# Patient Record
Sex: Female | Born: 1990
Health system: Southern US, Community
[De-identification: ages and names within clinical notes are randomized; demographics above are authoritative.]

## PROBLEM LIST (undated history)

## (undated) DIAGNOSIS — Z23 Encounter for immunization: Secondary | ICD-10-CM

## (undated) DIAGNOSIS — D229 Melanocytic nevi, unspecified: Secondary | ICD-10-CM

## (undated) DIAGNOSIS — R5383 Other fatigue: Secondary | ICD-10-CM

## (undated) DIAGNOSIS — F419 Anxiety disorder, unspecified: Secondary | ICD-10-CM

## (undated) HISTORY — PX: OTHER SURGICAL HISTORY: SHX169

## (undated) HISTORY — DX: Melanocytic nevi, unspecified: D22.9

## (undated) HISTORY — PX: MOLE REMOVAL: SHX2046

## (undated) HISTORY — DX: Anxiety disorder, unspecified: F41.9

## (undated) HISTORY — DX: Encounter for immunization: Z23

## (undated) HISTORY — DX: Other fatigue: R53.83

---
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
## 2016-09-17 DIAGNOSIS — S61412A Laceration without foreign body of left hand, initial encounter: Secondary | ICD-10-CM | POA: Diagnosis not present

## 2016-09-28 DIAGNOSIS — S61412S Laceration without foreign body of left hand, sequela: Secondary | ICD-10-CM | POA: Diagnosis not present

## 2016-12-30 DIAGNOSIS — N939 Abnormal uterine and vaginal bleeding, unspecified: Secondary | ICD-10-CM | POA: Diagnosis not present

## 2017-01-16 DIAGNOSIS — Z124 Encounter for screening for malignant neoplasm of cervix: Secondary | ICD-10-CM | POA: Diagnosis not present

## 2017-01-16 DIAGNOSIS — E669 Obesity, unspecified: Secondary | ICD-10-CM | POA: Diagnosis not present

## 2017-01-16 DIAGNOSIS — Z01411 Encounter for gynecological examination (general) (routine) with abnormal findings: Secondary | ICD-10-CM | POA: Diagnosis not present

## 2017-01-16 DIAGNOSIS — Z3202 Encounter for pregnancy test, result negative: Secondary | ICD-10-CM | POA: Diagnosis not present

## 2017-01-16 DIAGNOSIS — Z1151 Encounter for screening for human papillomavirus (HPV): Secondary | ICD-10-CM | POA: Diagnosis not present

## 2017-04-05 DIAGNOSIS — W57XXXA Bitten or stung by nonvenomous insect and other nonvenomous arthropods, initial encounter: Secondary | ICD-10-CM | POA: Diagnosis not present

## 2017-04-05 DIAGNOSIS — S70261A Insect bite (nonvenomous), right hip, initial encounter: Secondary | ICD-10-CM | POA: Diagnosis not present

## 2017-05-06 DIAGNOSIS — Z309 Encounter for contraceptive management, unspecified: Secondary | ICD-10-CM | POA: Diagnosis not present

## 2017-05-06 DIAGNOSIS — Z3202 Encounter for pregnancy test, result negative: Secondary | ICD-10-CM | POA: Diagnosis not present

## 2017-05-06 DIAGNOSIS — Z113 Encounter for screening for infections with a predominantly sexual mode of transmission: Secondary | ICD-10-CM | POA: Diagnosis not present

## 2017-08-11 DIAGNOSIS — Z23 Encounter for immunization: Secondary | ICD-10-CM | POA: Diagnosis not present

## 2017-08-21 DIAGNOSIS — J069 Acute upper respiratory infection, unspecified: Secondary | ICD-10-CM | POA: Diagnosis not present

## 2017-08-21 DIAGNOSIS — R05 Cough: Secondary | ICD-10-CM | POA: Diagnosis not present

## 2017-08-21 DIAGNOSIS — R300 Dysuria: Secondary | ICD-10-CM | POA: Diagnosis not present

## 2017-11-11 DIAGNOSIS — J069 Acute upper respiratory infection, unspecified: Secondary | ICD-10-CM | POA: Diagnosis not present

## 2017-11-11 DIAGNOSIS — R6889 Other general symptoms and signs: Secondary | ICD-10-CM | POA: Diagnosis not present

## 2017-12-16 ENCOUNTER — Ambulatory Visit: Payer: BLUE CROSS/BLUE SHIELD | Admitting: Obstetrics and Gynecology

## 2017-12-16 ENCOUNTER — Encounter: Payer: Self-pay | Admitting: Obstetrics and Gynecology

## 2017-12-16 VITALS — BP 122/84 | HR 99 | Ht 67.0 in | Wt 265.0 lb

## 2017-12-16 DIAGNOSIS — N898 Other specified noninflammatory disorders of vagina: Secondary | ICD-10-CM | POA: Diagnosis not present

## 2017-12-16 DIAGNOSIS — N6459 Other signs and symptoms in breast: Secondary | ICD-10-CM

## 2017-12-16 DIAGNOSIS — L309 Dermatitis, unspecified: Secondary | ICD-10-CM

## 2017-12-16 DIAGNOSIS — Z113 Encounter for screening for infections with a predominantly sexual mode of transmission: Secondary | ICD-10-CM

## 2017-12-16 DIAGNOSIS — N649 Disorder of breast, unspecified: Secondary | ICD-10-CM

## 2017-12-16 LAB — POCT WET PREP WITH KOH
Clue Cells Wet Prep HPF POC: NEGATIVE
KOH PREP POC: NEGATIVE
Trichomonas, UA: NEGATIVE
Yeast Wet Prep HPF POC: NEGATIVE

## 2017-12-16 NOTE — Progress Notes (Signed)
Chief Complaint  Patient presents with  . Breast Problem    Rash under arms, Lft breast had red spot and tender   . Vaginitis    HPI:      Melanie Frederick is a 27 y.o. G0P0000 who LMP was Patient's last menstrual period was 12/02/2017 (exact date)., presents today for increased vag d/c without odor, irritation. Sx for a few days, and pt just finished doxy. She has not been sex active for a few months. Neg STD check 8/18.  She has also had itching bilat axilla for the past month. Has not changed deodorant/detergent. Uses dryer sheets. Has tried OTC hydrocortisone crm which makes it burn. Has also had a red, bruised looking area above nipple LT breast. No masses, itch, nipple d/c. Sx for a couple days. No meds to treat.  Pt current on annual in [place]. Moved back here recently.   Past Medical History:  Diagnosis Date  . Nevus     Past Surgical History:  Procedure Laterality Date  . ARM SKIN LESION BIOPSY / EXCISION Left 2012  . LYMPH NODE DISSECTION Right 2011    History reviewed. No pertinent family history.  Social History   Socioeconomic History  . Marital status: Single    Spouse name: Not on file  . Number of children: Not on file  . Years of education: Not on file  . Highest education level: Not on file  Social Needs  . Financial resource strain: Not on file  . Food insecurity - worry: Not on file  . Food insecurity - inability: Not on file  . Transportation needs - medical: Not on file  . Transportation needs - non-medical: Not on file  Occupational History  . Not on file  Tobacco Use  . Smoking status: Never Smoker  . Smokeless tobacco: Never Used  Substance and Sexual Activity  . Alcohol use: Yes    Comment: occasinally   . Drug use: No  . Sexual activity: Not Currently    Birth control/protection: Pill  Other Topics Concern  . Not on file  Social History Narrative  . Not on file     Current Outpatient Medications:  .  JUNEL FE 1.5/30 1.5-30  MG-MCG tablet, Take 1 tablet by mouth daily., Disp: , Rfl: 8   ROS:  Review of Systems  Constitutional: Negative for fever.  Gastrointestinal: Negative for blood in stool, constipation, diarrhea, nausea and vomiting.  Genitourinary: Positive for vaginal discharge. Negative for dyspareunia, dysuria, flank pain, frequency, hematuria, urgency, vaginal bleeding and vaginal pain.  Musculoskeletal: Negative for back pain.  Skin: Positive for color change and rash.     OBJECTIVE:   Vitals:  BP 122/84   Pulse 99   Ht 5\' 7"  (1.702 m)   Wt 265 lb (120.2 kg)   LMP 12/02/2017 (Exact Date)   BMI 41.50 kg/m   Physical Exam  Constitutional: She is oriented to person, place, and time and well-developed, well-nourished, and in no distress. Vital signs are normal.  Pulmonary/Chest:    1:00 WITH SLIGHTLY DARKER DISCOLORATION, BUT NO LESIONS/MASSES/DERMATITIS  Genitourinary: Uterus normal, cervix normal, right adnexa normal, left adnexa normal and vulva normal. Uterus is not enlarged. Cervix exhibits no motion tenderness and no tenderness. Right adnexum displays no mass and no tenderness. Left adnexum displays no mass and no tenderness. Vulva exhibits no erythema, no exudate, no lesion, no rash and no tenderness. Vagina exhibits no lesion. Thin  odorless  yellow and vaginal discharge found.  Neurological: She is oriented to person, place, and time.  Skin: Skin is warm and dry. Rash noted. Rash is papular.  MILD ERYTHEMATOUS PAPULES RT AXILLA, NO LESIONS LT AXILLA; NO ULCERS/LAN  Vitals reviewed.   Results: Results for orders placed or performed in visit on 12/16/17 (from the past 24 hour(s))  POCT Wet Prep with KOH     Status: Normal   Collection Time: 12/16/17  5:00 PM  Result Value Ref Range   Trichomonas, UA Negative    Clue Cells Wet Prep HPF POC neg    Epithelial Wet Prep HPF POC  Few, Moderate, Many, Too numerous to count   Yeast Wet Prep HPF POC neg    Bacteria Wet Prep HPF POC  Few    RBC Wet Prep HPF POC     WBC Wet Prep HPF POC     KOH Prep POC Negative Negative     Assessment/Plan: Vaginal discharge - Neg wet prep/exam. F/u prn. sx. May need yeast vag tx since recently on abx. - Plan: POCT Wet Prep with KOH  Screening for STD (sexually transmitted disease) - Plan: Chlamydia/Gonococcus/Trichomonas, NAA  Dermatitis - Bilat axilla due to sx hx, question fungal. Try athlete's foot powder/d/c shaving for now/no dryer sheets on clothes. F/u prn.   Breast symptom - Slight discoloration. Neg exam. Reassurance. F/u prn.     Return if symptoms worsen or fail to improve.  Leyani Gargus B. Marieann Zipp, PA-C 12/16/2017 5:03 PM

## 2017-12-16 NOTE — Patient Instructions (Signed)
I value your feedback and entrusting us with your care. If you get a [facility] patient survey, I would appreciate you taking the time to let us know about your experience today. Thank you! 

## 2017-12-17 DIAGNOSIS — Z113 Encounter for screening for infections with a predominantly sexual mode of transmission: Secondary | ICD-10-CM | POA: Diagnosis not present

## 2017-12-20 LAB — CHLAMYDIA/GONOCOCCUS/TRICHOMONAS, NAA
Chlamydia by NAA: NEGATIVE
Gonococcus by NAA: NEGATIVE
Trich vag by NAA: NEGATIVE

## 2018-02-20 ENCOUNTER — Encounter: Payer: Self-pay | Admitting: Obstetrics and Gynecology

## 2018-02-20 ENCOUNTER — Ambulatory Visit (INDEPENDENT_AMBULATORY_CARE_PROVIDER_SITE_OTHER): Payer: BLUE CROSS/BLUE SHIELD | Admitting: Obstetrics and Gynecology

## 2018-02-20 VITALS — BP 122/70 | HR 99 | Ht 67.0 in | Wt 243.0 lb

## 2018-02-20 DIAGNOSIS — Z124 Encounter for screening for malignant neoplasm of cervix: Secondary | ICD-10-CM | POA: Diagnosis not present

## 2018-02-20 DIAGNOSIS — Z01419 Encounter for gynecological examination (general) (routine) without abnormal findings: Secondary | ICD-10-CM

## 2018-02-20 DIAGNOSIS — Z3041 Encounter for surveillance of contraceptive pills: Secondary | ICD-10-CM | POA: Diagnosis not present

## 2018-02-20 MED ORDER — JUNEL FE 1.5/30 1.5-30 MG-MCG PO TABS: 1.0000 | ORAL_TABLET | Freq: Every day | ORAL | 3 refills | Status: DC

## 2018-02-20 NOTE — Progress Notes (Signed)
PCP:  Patient, No Pcp Per   Chief Complaint  Patient presents with  . Gynecologic Exam     HPI:      Melanie Frederick is a 27 y.o. G0P0000 who LMP was Patient's last menstrual period was 01/30/2018 (approximate)., presents today for her annual examination.  Her menses are regular every 28-30 days, lasting 3 days.  Dysmenorrhea none. She does not have intermenstrual bleeding.  Sex activity: not sexually active. On OCPs. Last Pap: Feb 15, 2014  Results were: no abnormalities  Hx of STDs: none; last done 3/19 Vag d/c from 3/19 improved but still present; no itch/odor. Neg wet prep/STD testing at that time. Not sex active since.   There is no FH of breast cancer. There is no FH of ovarian cancer. The patient does not do self-breast exams.  Tobacco use: The patient denies current or previous tobacco use. Alcohol use: none No drug use.  Exercise: moderately active  She does not get adequate calcium and Vitamin D in her diet.   Past Medical History:  Diagnosis Date  . Nevus   . Vaccine for human papilloma virus (HPV) types 6, 11, 16, and 18 administered     Past Surgical History:  Procedure Laterality Date  . ARM SKIN LESION BIOPSY / EXCISION Left 2012  . LYMPH NODE DISSECTION Right 2011    History reviewed. No pertinent family history.  Social History   Socioeconomic History  . Marital status: Single    Spouse name: Not on file  . Number of children: Not on file  . Years of education: Not on file  . Highest education level: Not on file  Occupational History  . Not on file  Social Needs  . Financial resource strain: Not on file  . Food insecurity:    Worry: Not on file    Inability: Not on file  . Transportation needs:    Medical: Not on file    Non-medical: Not on file  Tobacco Use  . Smoking status: Never Smoker  . Smokeless tobacco: Never Used  Substance and Sexual Activity  . Alcohol use: Yes    Comment: occasinally   . Drug use: No  . Sexual  activity: Not Currently    Birth control/protection: Pill  Lifestyle  . Physical activity:    Days per week: Not on file    Minutes per session: Not on file  . Stress: Not on file  Relationships  . Social connections:    Talks on phone: Not on file    Gets together: Not on file    Attends religious service: Not on file    Active member of club or organization: Not on file    Attends meetings of clubs or organizations: Not on file    Relationship status: Not on file  . Intimate partner violence:    Fear of current or ex partner: Not on file    Emotionally abused: Not on file    Physically abused: Not on file    Forced sexual activity: Not on file  Other Topics Concern  . Not on file  Social History Narrative  . Not on file    Outpatient Medications Prior to Visit  Medication Sig Dispense Refill  . phentermine 15 MG capsule Take 15 mg by mouth every morning.    Lenda Kelp FE 1.5/30 1.5-30 MG-MCG tablet Take 1 tablet by mouth daily.  8   No facility-administered medications prior to visit.  ROS:  Review of Systems  Constitutional: Negative for fatigue, fever and unexpected weight change.  Respiratory: Negative for cough, shortness of breath and wheezing.   Cardiovascular: Negative for chest pain, palpitations and leg swelling.  Gastrointestinal: Negative for blood in stool, constipation, diarrhea, nausea and vomiting.  Endocrine: Negative for cold intolerance, heat intolerance and polyuria.  Genitourinary: Negative for dyspareunia, dysuria, flank pain, frequency, genital sores, hematuria, menstrual problem, pelvic pain, urgency, vaginal bleeding, vaginal discharge and vaginal pain.  Musculoskeletal: Negative for back pain, joint swelling and myalgias.  Skin: Negative for rash.  Neurological: Negative for dizziness, syncope, light-headedness, numbness and headaches.  Hematological: Negative for adenopathy.  Psychiatric/Behavioral: Negative for agitation, confusion, sleep  disturbance and suicidal ideas. The patient is not nervous/anxious.   BREAST: No symptoms   Objective: BP 122/70   Pulse 99   Ht 5\' 7"  (1.702 m)   Wt 243 lb (110.2 kg)   LMP 01/30/2018 (Approximate)   BMI 38.06 kg/m    Physical Exam  Constitutional: She is oriented to person, place, and time. She appears well-developed and well-nourished.  Genitourinary: Vagina normal and uterus normal. There is no rash or tenderness on the right labia. There is no rash or tenderness on the left labia. No erythema or tenderness in the vagina. No vaginal discharge found. Right adnexum does not display mass and does not display tenderness. Left adnexum does not display mass and does not display tenderness. Cervix does not exhibit motion tenderness or polyp. Uterus is not enlarged or tender.  Neck: Normal range of motion. No thyromegaly present.  Cardiovascular: Normal rate, regular rhythm and normal heart sounds.  No murmur heard. Pulmonary/Chest: Effort normal and breath sounds normal. Right breast exhibits no mass, no nipple discharge, no skin change and no tenderness. Left breast exhibits no mass, no nipple discharge, no skin change and no tenderness.  Abdominal: Soft. There is no tenderness. There is no guarding.  Musculoskeletal: Normal range of motion.  Neurological: She is alert and oriented to person, place, and time. No cranial nerve deficit.  Psychiatric: She has a normal mood and affect. Her behavior is normal.  Vitals reviewed.   Assessment/Plan: Encounter for annual routine gynecological examination  Cervical cancer screening - Plan: IGP, rfx Aptima HPV ASCU, CANCELED: IGP,CtNgTv,rfx Aptima HPV ASCU  Encounter for surveillance of contraceptive pills - OCP RF - Plan: JUNEL FE 1.5/30 1.5-30 MG-MCG tablet  Meds ordered this encounter  Medications  . JUNEL FE 1.5/30 1.5-30 MG-MCG tablet    Sig: Take 1 tablet by mouth daily.    Dispense:  3 Package    Refill:  3    Order Specific  Question:   Supervising Provider    Answer:   Gae Dry [[id number]]             GYN counsel adequate intake of calcium and vitamin D, diet and exercise     F/U  Return in about 1 year (around 02/21/2019).  Alicia B. Copland, PA-C 02/20/2018 10:48 AM

## 2018-02-20 NOTE — Patient Instructions (Signed)
I value your feedback and entrusting us with your care. If you get a [facility] patient survey, I would appreciate you taking the time to let us know about your experience today. Thank you! 

## 2018-02-24 LAB — IGP, RFX APTIMA HPV ASCU: PAP SMEAR COMMENT: 0

## 2018-05-13 ENCOUNTER — Ambulatory Visit: Payer: Self-pay | Admitting: Dietician

## 2018-05-15 ENCOUNTER — Encounter: Payer: Self-pay | Admitting: Dietician

## 2018-09-09 ENCOUNTER — Ambulatory Visit (INDEPENDENT_AMBULATORY_CARE_PROVIDER_SITE_OTHER): Payer: BLUE CROSS/BLUE SHIELD | Admitting: Obstetrics and Gynecology

## 2018-09-09 ENCOUNTER — Encounter: Payer: Self-pay | Admitting: Obstetrics and Gynecology

## 2018-09-09 ENCOUNTER — Other Ambulatory Visit (HOSPITAL_COMMUNITY)
Admission: RE | Admit: 2018-09-09 | Discharge: 2018-09-09 | Disposition: A | Discharge status: Home / Self Care | Payer: BLUE CROSS/BLUE SHIELD | Source: Ambulatory Visit | Attending: Obstetrics and Gynecology | Admitting: Obstetrics and Gynecology

## 2018-09-09 VITALS — BP 100/70 | HR 74 | Ht 67.0 in | Wt 234.0 lb

## 2018-09-09 DIAGNOSIS — Z113 Encounter for screening for infections with a predominantly sexual mode of transmission: Secondary | ICD-10-CM | POA: Diagnosis not present

## 2018-09-09 DIAGNOSIS — Z3202 Encounter for pregnancy test, result negative: Secondary | ICD-10-CM | POA: Diagnosis not present

## 2018-09-09 DIAGNOSIS — N898 Other specified noninflammatory disorders of vagina: Secondary | ICD-10-CM | POA: Diagnosis not present

## 2018-09-09 DIAGNOSIS — N921 Excessive and frequent menstruation with irregular cycle: Secondary | ICD-10-CM

## 2018-09-09 LAB — POCT URINE PREGNANCY: Preg Test, Ur: NEGATIVE

## 2018-09-09 NOTE — Patient Instructions (Signed)
I value your feedback and entrusting us with your care. If you get a [facility] patient survey, I would appreciate you taking the time to let us know about your experience today. Thank you! 

## 2018-09-09 NOTE — Progress Notes (Signed)
Patient, No Pcp Per   Chief Complaint  Patient presents with  . Vaginal Bleeding    for the past month pt has been spotting and cramping, yesterday she had some watery fluid rolling down her leg (only time she has seen this)    HPI:      Ms. Latiya DENIJAH KARRER is a 27 y.o. G0P0000 who LMP was Patient's last menstrual period was 08/18/2018 (approximate)., presents today for spotting/cramping last pill pack without late/missed pills. PMP was normal but LMP (last wk) was just spotting. Pt started new pill pack this wk and bleeding has stopped.  Menses usually are monthly, lasting 4-5 days, no BTB.  Pt is sex active with new partner, on OCPs, sometimes using condoms. Pt has noted increased watery d/c without odor, irritation recently.   Past Medical History:  Diagnosis Date  . Fatigue   . Nevus   . Vaccine for human papilloma virus (HPV) types 6, 11, 16, and 18 administered     Past Surgical History:  Procedure Laterality Date  . ARM SKIN LESION BIOPSY / EXCISION Left 2012  . LYMPH NODE DISSECTION Right 2011    Family History  Problem Relation Age of Onset  . Hypothyroidism Paternal Grandmother   . Brain cancer Other        malignant, great mat uncle-60  . Cancer Other 60       colon cancer    Social History   Socioeconomic History  . Marital status: Single    Spouse name: Not on file  . Number of children: Not on file  . Years of education: Not on file  . Highest education level: Not on file  Occupational History  . Not on file  Social Needs  . Financial resource strain: Not on file  . Food insecurity:    Worry: Not on file    Inability: Not on file  . Transportation needs:    Medical: Not on file    Non-medical: Not on file  Tobacco Use  . Smoking status: Never Smoker  . Smokeless tobacco: Never Used  Substance and Sexual Activity  . Alcohol use: Yes    Comment: occasinally   . Drug use: No  . Sexual activity: Yes    Birth control/protection: Pill    Lifestyle  . Physical activity:    Days per week: Not on file    Minutes per session: Not on file  . Stress: Not on file  Relationships  . Social connections:    Talks on phone: Not on file    Gets together: Not on file    Attends religious service: Not on file    Active member of club or organization: Not on file    Attends meetings of clubs or organizations: Not on file    Relationship status: Not on file  . Intimate partner violence:    Fear of current or ex partner: Not on file    Emotionally abused: Not on file    Physically abused: Not on file    Forced sexual activity: Not on file  Other Topics Concern  . Not on file  Social History Narrative  . Not on file    Outpatient Medications Prior to Visit  Medication Sig Dispense Refill  . JUNEL FE 1.5/30 1.5-30 MG-MCG tablet Take 1 tablet by mouth daily. 3 Package 3  . phentermine 15 MG capsule Take 15 mg by mouth every morning.     No facility-administered medications prior to  visit.     ROS:  Review of Systems  Constitutional: Negative for fever.  Gastrointestinal: Negative for blood in stool, constipation, diarrhea, nausea and vomiting.  Genitourinary: Positive for dyspareunia, vaginal bleeding and vaginal discharge. Negative for dysuria, flank pain, frequency, hematuria, urgency and vaginal pain.  Musculoskeletal: Negative for back pain.  Skin: Negative for rash.  Neurological: Positive for headaches.   BREAST: No symptoms   OBJECTIVE:   Vitals:  BP 100/70   Pulse 74   Ht 5\' 7"  (1.702 m)   Wt 234 lb (106.1 kg)   LMP 08/18/2018 (Approximate)   BMI 36.65 kg/m   Physical Exam  Constitutional: She is oriented to person, place, and time. Vital signs are normal. She appears well-developed.  Pulmonary/Chest: Effort normal.  Genitourinary: Uterus normal. There is no rash, tenderness or lesion on the right labia. There is no rash, tenderness or lesion on the left labia. Uterus is not enlarged and not tender.  Cervix exhibits no motion tenderness. Right adnexum displays no mass and no tenderness. Left adnexum displays no mass and no tenderness. No erythema or tenderness in the vagina. Vaginal discharge found.  Musculoskeletal: Normal range of motion.  Neurological: She is alert and oriented to person, place, and time.  Psychiatric: She has a normal mood and affect. Her behavior is normal. Thought content normal.  Vitals reviewed.   Results: Results for orders placed or performed in visit on 09/09/18 (from the past 24 hour(s))  POCT urine pregnancy     Status: Normal   Collection Time: 09/09/18  2:36 PM  Result Value Ref Range   Preg Test, Ur Negative Negative    Assessment/Plan: Breakthrough bleeding on OCPs - Neg UPT. Sx stopped with new pill pack. Check gon/chlam. If neg, reassurance. F/u prn. May need to change OCPs if sx persist. - Plan: POCT urine pregnancy  Vaginal discharge - Check gon/chlam. Will f/u if pos. If neg, reassurance. - Plan: Cervicovaginal ancillary only  Screening for STD (sexually transmitted disease) - Plan: Cervicovaginal ancillary only    Return if symptoms worsen or fail to improve.  Chloe Flis B. Lenoir Facchini, PA-C 09/09/2018 2:51 PM

## 2018-09-10 LAB — CERVICOVAGINAL ANCILLARY ONLY
Chlamydia: NEGATIVE
Neisseria Gonorrhea: NEGATIVE

## 2018-09-21 ENCOUNTER — Encounter: Payer: Self-pay | Admitting: Obstetrics and Gynecology

## 2018-11-03 DIAGNOSIS — R0781 Pleurodynia: Secondary | ICD-10-CM | POA: Diagnosis not present

## 2018-11-03 DIAGNOSIS — K625 Hemorrhage of anus and rectum: Secondary | ICD-10-CM | POA: Diagnosis not present

## 2018-11-03 DIAGNOSIS — K6289 Other specified diseases of anus and rectum: Secondary | ICD-10-CM | POA: Diagnosis not present

## 2018-11-10 DIAGNOSIS — K625 Hemorrhage of anus and rectum: Secondary | ICD-10-CM | POA: Diagnosis not present

## 2018-11-10 DIAGNOSIS — K6289 Other specified diseases of anus and rectum: Secondary | ICD-10-CM | POA: Diagnosis not present

## 2019-02-20 ENCOUNTER — Other Ambulatory Visit: Payer: Self-pay | Admitting: Obstetrics and Gynecology

## 2019-02-20 DIAGNOSIS — Z3041 Encounter for surveillance of contraceptive pills: Secondary | ICD-10-CM

## 2019-03-03 ENCOUNTER — Other Ambulatory Visit: Payer: Self-pay

## 2019-03-06 LAB — URINE CULTURE

## 2019-03-10 NOTE — Progress Notes (Deleted)
PCP:  Patient, No Pcp Per   No chief complaint on file.    HPI:      [name] is a 28 y.o. G0P0000 who LMP was No LMP recorded., presents today for her annual examination.  Her menses are regular every 28-30 days, lasting 3 days.  Dysmenorrhea none. She does not have intermenstrual bleeding. ***Had BTB 12/19  Sex activity: not sexually active. On OCPs. Last Pap: 02/20/18 Results were: no abnormalities  Hx of STDs: none; last done 12/19 Vag d/c from 3/19 improved but still present; no itch/odor. Neg wet prep/STD testing at that time. Not sex active since.   There is no FH of breast cancer. There is no FH of ovarian cancer. The patient does not do self-breast exams.  Tobacco use: The patient denies current or previous tobacco use. Alcohol use: none No drug use.  Exercise: moderately active  She does not get adequate calcium and Vitamin D in her diet.   Past Medical History:  Diagnosis Date  . Fatigue   . Nevus   . Vaccine for human papilloma virus (HPV) types 6, 11, 16, and 18 administered     Past Surgical History:  Procedure Laterality Date  . ARM SKIN LESION BIOPSY / EXCISION Left 2012  . LYMPH NODE DISSECTION Right 2011    Family History  Problem Relation Age of Onset  . Hypothyroidism Paternal Grandmother   . Brain cancer Other        malignant, great mat uncle-60  . Cancer Other 60       colon cancer    Social History   Socioeconomic History  . Marital status: Single    Spouse name: Not on file  . Number of children: Not on file  . Years of education: Not on file  . Highest education level: Not on file  Occupational History  . Not on file  Social Needs  . Financial resource strain: Not on file  . Food insecurity:    Worry: Not on file    Inability: Not on file  . Transportation needs:    Medical: Not on file    Non-medical: Not on file  Tobacco Use  . Smoking status: Never Smoker  . Smokeless tobacco: Never Used  Substance and  Sexual Activity  . Alcohol use: Yes    Comment: occasinally   . Drug use: No  . Sexual activity: Yes    Birth control/protection: Pill  Lifestyle  . Physical activity:    Days per week: Not on file    Minutes per session: Not on file  . Stress: Not on file  Relationships  . Social connections:    Talks on phone: Not on file    Gets together: Not on file    Attends religious service: Not on file    Active member of club or organization: Not on file    Attends meetings of clubs or organizations: Not on file    Relationship status: Not on file  . Intimate partner violence:    Fear of current or ex partner: Not on file    Emotionally abused: Not on file    Physically abused: Not on file    Forced sexual activity: Not on file  Other Topics Concern  . Not on file  Social History Narrative  . Not on file    Outpatient Medications Prior to Visit  Medication Sig Dispense Refill  . JUNEL FE 1.5/30 1.5-30 MG-MCG tablet Take 1 tablet  by mouth daily. 3 Package 3   No facility-administered medications prior to visit.     ROS:  Review of Systems  Constitutional: Negative for fatigue, fever and unexpected weight change.  Respiratory: Negative for cough, shortness of breath and wheezing.   Cardiovascular: Negative for chest pain, palpitations and leg swelling.  Gastrointestinal: Negative for blood in stool, constipation, diarrhea, nausea and vomiting.  Endocrine: Negative for cold intolerance, heat intolerance and polyuria.  Genitourinary: Negative for dyspareunia, dysuria, flank pain, frequency, genital sores, hematuria, menstrual problem, pelvic pain, urgency, vaginal bleeding, vaginal discharge and vaginal pain.  Musculoskeletal: Negative for back pain, joint swelling and myalgias.  Skin: Negative for rash.  Neurological: Negative for dizziness, syncope, light-headedness, numbness and headaches.  Hematological: Negative for adenopathy.  Psychiatric/Behavioral: Negative for  agitation, confusion, sleep disturbance and suicidal ideas. The patient is not nervous/anxious.   BREAST: No symptoms   Objective: There were no vitals taken for this visit.   Physical Exam Constitutional:      Appearance: She is well-developed.  Genitourinary:     Vagina and uterus normal.     No vaginal discharge, erythema or tenderness.     No cervical motion tenderness or polyp.     Uterus is not enlarged or tender.     No right or left adnexal mass present.     Right adnexa not tender.     Left adnexa not tender.  Neck:     Musculoskeletal: Normal range of motion.     Thyroid: No thyromegaly.  Cardiovascular:     Rate and Rhythm: Normal rate and regular rhythm.     Heart sounds: Normal heart sounds. No murmur.  Pulmonary:     Effort: Pulmonary effort is normal.     Breath sounds: Normal breath sounds.  Chest:     Breasts:        Right: No mass, nipple discharge, skin change or tenderness.        Left: No mass, nipple discharge, skin change or tenderness.  Abdominal:     Palpations: Abdomen is soft.     Tenderness: There is no abdominal tenderness. There is no guarding.  Musculoskeletal: Normal range of motion.  Neurological:     Mental Status: She is alert and oriented to person, place, and time.     Cranial Nerves: No cranial nerve deficit.  Psychiatric:        Behavior: Behavior normal.  Vitals signs reviewed.     Assessment/Plan: No diagnosis found.  No orders of the defined types were placed in this encounter.            GYN counsel adequate intake of calcium and vitamin D, diet and exercise     F/U  No follow-ups on file.  Alicia B. Copland, PA-C 03/10/2019 4:02 PM

## 2019-03-11 ENCOUNTER — Ambulatory Visit: Payer: BLUE CROSS/BLUE SHIELD | Admitting: Obstetrics and Gynecology

## 2019-04-30 ENCOUNTER — Encounter: Payer: Self-pay | Admitting: Obstetrics and Gynecology

## 2019-05-01 ENCOUNTER — Ambulatory Visit: Payer: BLUE CROSS/BLUE SHIELD | Admitting: Obstetrics and Gynecology

## 2019-08-22 DIAGNOSIS — Z20828 Contact with and (suspected) exposure to other viral communicable diseases: Secondary | ICD-10-CM | POA: Diagnosis not present

## 2019-09-17 ENCOUNTER — Encounter: Payer: Self-pay | Admitting: Obstetrics and Gynecology

## 2019-11-16 NOTE — Patient Instructions (Addendum)
Preventive Care 21-27 Years Old, Female Preventive care refers to visits with your health care provider and lifestyle choices that can promote health and wellness. This includes:  A yearly physical exam. This may also be called an annual well check.  Regular dental visits and eye exams.  Immunizations.  Screening for certain conditions.  Healthy lifestyle choices, such as eating a healthy diet, getting regular exercise, not using drugs or products that contain nicotine and tobacco, and limiting alcohol use. What can I expect for my preventive care visit? Physical exam Your health care provider will check your:  Height and weight. This may be used to calculate body mass index (BMI), which tells if you are at a healthy weight.  Heart rate and blood pressure.  Skin for abnormal spots. Counseling Your health care provider may ask you questions about your:  Alcohol, tobacco, and drug use.  Emotional well-being.  Home and relationship well-being.  Sexual activity.  Eating habits.  Work and work environment.  Method of birth control.  Menstrual cycle.  Pregnancy history. What immunizations do I need?  Influenza (flu) vaccine  This is recommended every year. Tetanus, diphtheria, and pertussis (Tdap) vaccine  You may need a Td booster every 10 years. Varicella (chickenpox) vaccine  You may need this if you have not been vaccinated. Human papillomavirus (HPV) vaccine  If recommended by your health care provider, you may need three doses over 6 months. Measles, mumps, and rubella (MMR) vaccine  You may need at least one dose of MMR. You may also need a second dose. Meningococcal conjugate (MenACWY) vaccine  One dose is recommended if you are age 19-21 years and a first-year college student living in a residence hall, or if you have one of several medical conditions. You may also need additional booster doses. Pneumococcal conjugate (PCV13) vaccine  You may need  this if you have certain conditions and were not previously vaccinated. Pneumococcal polysaccharide (PPSV23) vaccine  You may need one or two doses if you smoke cigarettes or if you have certain conditions. Hepatitis A vaccine  You may need this if you have certain conditions or if you travel or work in places where you may be exposed to hepatitis A. Hepatitis B vaccine  You may need this if you have certain conditions or if you travel or work in places where you may be exposed to hepatitis B. Haemophilus influenzae type b (Hib) vaccine  You may need this if you have certain conditions. You may receive vaccines as individual doses or as more than one vaccine together in one shot (combination vaccines). Talk with your health care provider about the risks and benefits of combination vaccines. What tests do I need?  Blood tests  Lipid and cholesterol levels. These may be checked every 5 years starting at age 20.  Hepatitis C test.  Hepatitis B test. Screening  Diabetes screening. This is done by checking your blood sugar (glucose) after you have not eaten for a while (fasting).  Sexually transmitted disease (STD) testing.  BRCA-related cancer screening. This may be done if you have a family history of breast, ovarian, tubal, or peritoneal cancers.  Pelvic exam and Pap test. This may be done every 3 years starting at age 21. Starting at age 30, this may be done every 5 years if you have a Pap test in combination with an HPV test. Talk with your health care provider about your test results, treatment options, and if necessary, the need for more tests.   Follow these instructions at home: Eating and drinking   Eat a diet that includes fresh fruits and vegetables, whole grains, lean protein, and low-fat dairy.  Take vitamin and mineral supplements as recommended by your health care provider.  Do not drink alcohol if: ? Your health care provider tells you not to drink. ? You are  pregnant, may be pregnant, or are planning to become pregnant.  If you drink alcohol: ? Limit how much you have to 0-1 drink a day. ? Be aware of how much alcohol is in your drink. In the [place] one drink equals one 12 oz bottle of beer (355 mL), one 5 oz glass of wine (148 mL), or one 1 oz glass of hard liquor (44 mL). Lifestyle  Take daily care of your teeth and gums.  Stay active. Exercise for at least 30 minutes on 5 or more days each week.  Do not use any products that contain nicotine or tobacco, such as cigarettes, e-cigarettes, and chewing tobacco. If you need help quitting, ask your health care provider.  If you are sexually active, practice safe sex. Use a condom or other form of birth control (contraception) in order to prevent pregnancy and STIs (sexually transmitted infections). If you plan to become pregnant, see your health care provider for a preconception visit. What's next?  Visit your health care provider once a year for a well check visit.  Ask your health care provider how often you should have your eyes and teeth checked.  Stay up to date on all vaccines. This information is not intended to replace advice given to you by your health care provider. Make sure you discuss any questions you have with your health care provider. Document Revised: 06/05/2018 Document Reviewed: 06/05/2018 Elsevier Patient Education  2020 [employer]. Breast Self-Awareness Breast self-awareness is knowing how your breasts look and feel. Doing breast self-awareness is important. It allows you to catch a breast problem early while it is still small and can be treated. All women should do breast self-awareness, including women who have had breast implants. Tell your doctor if you notice a change in your breasts. What you need:  A mirror.  A well-lit room. How to do a breast self-exam A breast self-exam is one way to learn what is normal for your breasts and to check for changes. To do a  breast self-exam: Look for changes  1. Take off all the clothes above your waist. 2. Stand in front of a mirror in a room with good lighting. 3. Put your hands on your hips. 4. Push your hands down. 5. Look at your breasts and nipples in the mirror to see if one breast or nipple looks different from the other. Check to see if: ? The shape of one breast is different. ? The size of one breast is different. ? There are wrinkles, dips, and bumps in one breast and not the other. 6. Look at each breast for changes in the skin, such as: ? Redness. ? Scaly areas. 7. Look for changes in your nipples, such as: ? Liquid around the nipples. ? Bleeding. ? Dimpling. ? Redness. ? A change in where the nipples are. Feel for changes  1. Lie on your back on the floor. 2. Feel each breast. To do this, follow these steps: ? Pick a breast to feel. ? Put the arm closest to that breast above your head. ? Use your other arm to feel the nipple area of your breast. Feel   breast. Feel the area with the pads of your three middle fingers by making small circles with your fingers. For the first circle, press lightly. For the second circle, press harder. For the third circle, press even harder. ? Keep making circles with your fingers at the different pressures as you move down your breast. Stop when you feel your ribs. ? Move your fingers a little toward the center of your body. ? Start making circles with your fingers again, this time going up until you reach your collarbone. ? Keep making up-and-down circles until you reach your armpit. Remember to keep using the three pressures. ? Feel the other breast in the same way. 3. Sit or stand in the tub or shower. 4. With soapy water on your skin, feel each breast the same way you did in step 2 when you were lying on the floor. Write down what you find Writing down what you find can help you remember what to tell your doctor. Write down:  What is normal for each breast.  Any  changes you find in each breast, including: ? The kind of changes you find. ? Whether you have pain. ? Size and location of any lumps.  When you last had your menstrual period. General tips  Check your breasts every month.  If you are breastfeeding, the best time to check your breasts is after you feed your baby or after you use a breast pump.  If you get menstrual periods, the best time to check your breasts is 5-7 days after your menstrual period is over.  With time, you will become comfortable with the self-exam, and you will begin to know if there are changes in your breasts. Contact a doctor if you:  See a change in the shape or size of your breasts or nipples.  See a change in the skin of your breast or nipples, such as red or scaly skin.  Have fluid coming from your nipples that is not normal.  Find a lump or thick area that was not there before.  Have pain in your breasts.  Have any concerns about your breast health. Summary  Breast self-awareness includes looking for changes in your breasts, as well as feeling for changes within your breasts.  Breast self-awareness should be done in front of a mirror in a well-lit room.  You should check your breasts every month. If you get menstrual periods, the best time to check your breasts is 5-7 days after your menstrual period is over.  Let your doctor know of any changes you see in your breasts, including changes in size, changes on the skin, pain or tenderness, or fluid from your nipples that is not normal. This information is not intended to replace advice given to you by your health care provider. Make sure you discuss any questions you have with your health care provider. Document Revised: 05/13/2018 Document Reviewed: 05/13/2018 Elsevier Patient Education  [place].    Polycystic Ovarian Syndrome  Polycystic ovarian syndrome (PCOS) is a common hormonal disorder among women of reproductive age. In most women  with PCOS, many small fluid-filled sacs (cysts) grow on the ovaries, and the cysts are not part of a normal menstrual cycle. PCOS can cause problems with your menstrual periods and make it difficult to get pregnant. It can also cause an increased risk of miscarriage with pregnancy. If it is not treated, PCOS can lead to serious health problems, such as diabetes and heart disease. What are the  causes? The cause of PCOS is not known, but it may be the result of a combination of certain factors, such as:  Irregular menstrual cycle.  High levels of certain hormones (androgens).  Problems with the hormone that helps to control blood sugar (insulin resistance).  Certain genes. What increases the risk? This condition is more likely to develop in women who have a family history of PCOS. What are the signs or symptoms? Symptoms of PCOS may include:  Multiple ovarian cysts.  Infrequent periods or no periods.  Periods that are too frequent or too heavy.  Unpredictable periods.  Inability to get pregnant (infertility) because of not ovulating.  Increased growth of hair on the face, chest, stomach, back, thumbs, thighs, or toes.  Acne or oily skin. Acne may develop during adulthood, and it may not respond to treatment.  Pelvic pain.  Weight gain or obesity.  Patches of thickened and dark brown or black skin on the neck, arms, breasts, or thighs (acanthosis nigricans).  Excess hair growth on the face, chest, abdomen, or upper thighs (hirsutism). How is this diagnosed? This condition is diagnosed based on:  Your medical history.  A physical exam, including a pelvic exam. Your health care provider may look for areas of increased hair growth on your skin.  Tests, such as: ? Ultrasound. This may be used to examine the ovaries and the lining of the uterus (endometrium) for cysts. ? Blood tests. These may be used to check levels of sugar (glucose), female hormone (testosterone), and female  hormones (estrogen and progesterone) in your blood. How is this treated? There is no cure for PCOS, but treatment can help to manage symptoms and prevent more health problems from developing. Treatment varies depending on:  Your symptoms.  Whether you want to have a baby or whether you need birth control (contraception). Treatment may include nutrition and lifestyle changes along with:  Progesterone hormone to start a menstrual period.  Birth control pills to help you have regular menstrual periods.  Medicines to make you ovulate, if you want to get pregnant.  Medicine to reduce excessive hair growth.  Surgery, in severe cases. This may involve making small holes in one or both of your ovaries. This decreases the amount of testosterone that your body produces. Follow these instructions at home:  Take over-the-counter and prescription medicines only as told by your health care provider.  Follow a healthy meal plan. This can help you reduce the effects of PCOS. ? Eat a healthy diet that includes lean proteins, complex carbohydrates, fresh fruits and vegetables, low-fat dairy products, and healthy fats. Make sure to eat enough fiber.  If you are overweight, lose weight as told by your health care provider. ? Losing 10% of your body weight may improve symptoms. ? Your health care provider can determine how much weight loss is best for you and can help you lose weight safely.  Keep all follow-up visits as told by your health care provider. This is important. Contact a health care provider if:  Your symptoms do not get better with medicine.  You develop new symptoms. This information is not intended to replace advice given to you by your health care provider. Make sure you discuss any questions you have with your health care provider. Document Revised: 09/06/2017 Document Reviewed: 03/11/2016 Elsevier Patient Education  2020 [employer].

## 2019-11-16 NOTE — Progress Notes (Signed)
Pt present today for annual exam and est care with a new provider. Pt stated that she was doing well no problems. Pt has questions concerning birth control. Pt declined flu vaccine.

## 2019-11-17 ENCOUNTER — Ambulatory Visit (INDEPENDENT_AMBULATORY_CARE_PROVIDER_SITE_OTHER): Payer: Managed Care, Other (non HMO) | Admitting: Obstetrics and Gynecology

## 2019-11-17 ENCOUNTER — Encounter: Payer: Self-pay | Admitting: Obstetrics and Gynecology

## 2019-11-17 ENCOUNTER — Other Ambulatory Visit: Payer: Self-pay

## 2019-11-17 VITALS — BP 114/78 | HR 67 | Ht 67.0 in | Wt 292.0 lb

## 2019-11-17 DIAGNOSIS — Z3041 Encounter for surveillance of contraceptive pills: Secondary | ICD-10-CM

## 2019-11-17 DIAGNOSIS — N926 Irregular menstruation, unspecified: Secondary | ICD-10-CM | POA: Diagnosis not present

## 2019-11-17 DIAGNOSIS — Z6841 Body Mass Index (BMI) 40.0 and over, adult: Secondary | ICD-10-CM | POA: Diagnosis not present

## 2019-11-17 DIAGNOSIS — Z01419 Encounter for gynecological examination (general) (routine) without abnormal findings: Secondary | ICD-10-CM

## 2019-11-17 DIAGNOSIS — L689 Hypertrichosis, unspecified: Secondary | ICD-10-CM

## 2019-11-17 MED ORDER — DROSPIRENONE-ETHINYL ESTRADIOL 3-0.02 MG PO TABS: 1.0000 | ORAL_TABLET | Freq: Every day | ORAL | 4 refills | Status: DC

## 2019-11-17 NOTE — Progress Notes (Signed)
GYNECOLOGY ANNUAL PHYSICAL EXAM PROGRESS NOTE  Subjective:    Melanie Frederick is a 29 y.o. G0P0000 female who presents to establish care and for an annual exam. She is transitioning care from [facility]. Melanie Frederick is sexually active (same partner x 1 year). She patient wears seatbelts: yes. The patient participates in regular exercise: no. The patient reports that there is not domestic violence in her life.   Melanie Frederick has the following complaints today:  1. Desires to discuss other options for her birth control.  Currently on Junel 30 mcg, but notes that last month she bled for over 1 month.  The bleeding was overall light but persistent.  She does have a h/o irregular cycles in the past, that usually is regulated by contraception.  Additionally, she complains of pain on her right side, intermittent.  Lastly, she notes new onset of hair growth on her chest.  She has had a history of hair growth on her chin since high school.   Gynecologic History  Menarche age: 10 or 71 Patient's last menstrual period was 11/12/2019. Contraception: OCP (estrogen/progesterone) History of STI's: Denies Last Pap: 02/20/2018. Results were: normal.  Denies h/o abnormal pap smears.    OB History  Gravida Para Term Preterm AB Living  0 0 0 0 0 0  SAB TAB Ectopic Multiple Live Births  0 0 0 0 0    Past Medical History:  Diagnosis Date  . Fatigue   . Nevus   . Vaccine for human papilloma virus (HPV) types 6, 11, 16, and 18 administered     Past Surgical History:  Procedure Laterality Date  . ARM SKIN LESION BIOPSY / EXCISION Left 2012  . LYMPH NODE DISSECTION Right 2011    Family History  Problem Relation Age of Onset  . Hypothyroidism Paternal Grandmother   . Brain cancer Other        malignant, great mat uncle-60  . Cancer Other 60       colon cancer  . Kidney disease Mother   . Anemia Mother     Social History   Socioeconomic History  . Marital status: Single    Spouse name:  Not on file  . Number of children: Not on file  . Years of education: Not on file  . Highest education level: Not on file  Occupational History  . Not on file  Tobacco Use  . Smoking status: Never Smoker  . Smokeless tobacco: Never Used  Substance and Sexual Activity  . Alcohol use: Yes    Comment: occasinally   . Drug use: No  . Sexual activity: Yes    Birth control/protection: Pill  Other Topics Concern  . Not on file  Social History Narrative  . Not on file   Social Determinants of Health   Financial Resource Strain:   . Difficulty of Paying Living Expenses: Not on file  Food Insecurity:   . Worried About Charity fundraiser in the Last Year: Not on file  . Ran Out of Food in the Last Year: Not on file  Transportation Needs:   . Lack of Transportation (Medical): Not on file  . Lack of Transportation (Non-Medical): Not on file  Physical Activity:   . Days of Exercise per Week: Not on file  . Minutes of Exercise per Session: Not on file  Stress:   . Feeling of Stress : Not on file  Social Connections:   . Frequency of Communication with Friends and Family:  Not on file  . Frequency of Social Gatherings with Friends and Family: Not on file  . Attends Religious Services: Not on file  . Active Member of Clubs or Organizations: Not on file  . Attends Archivist Meetings: Not on file  . Marital Status: Not on file  Intimate Partner Violence:   . Fear of Current or Ex-Partner: Not on file  . Emotionally Abused: Not on file  . Physically Abused: Not on file  . Sexually Abused: Not on file    Current Outpatient Medications on File Prior to Visit  Medication Sig Dispense Refill  . ELDERBERRY PO Take by mouth.    Melanie Frederick FE 1.5/30 1.5-30 MG-MCG tablet Take 1 tablet by mouth daily. 3 Package 3   No current facility-administered medications on file prior to visit.    Allergies  Allergen Reactions  . Mushroom Extract Complex Hives and Swelling  . Prednisone  Palpitations     Review of Systems Constitutional: negative for chills, fatigue, fevers and sweats Eyes: negative for irritation, redness and visual disturbance Ears, nose, mouth, throat, and face: negative for hearing loss, nasal congestion, snoring and tinnitus Respiratory: negative for asthma, cough, sputum Cardiovascular: negative for chest pain, dyspnea, exertional chest pressure/discomfort, irregular heart beat, palpitations and syncope Gastrointestinal: negative for abdominal pain, change in bowel habits, nausea and vomiting Genitourinary: positive for abnormal menstrual periods and pelvic pain (see HPI), genital lesions, sexual problems and vaginal discharge, dysuria and urinary incontinence Integument/breast: negative for breast lump, breast tenderness and nipple discharge Hematologic/lymphatic: negative for bleeding and easy bruising Musculoskeletal:negative for back pain and muscle weakness Neurological: negative for dizziness, headaches, vertigo and weakness Endocrine: negative for diabetic symptoms including polydipsia, polyuria and skin dryness. Positive for excessive hair growth.  Allergic/Immunologic: negative for hay fever and urticaria        Objective:  Blood pressure 114/78, pulse 67, height 5\' 7"  (1.702 m), weight 292 lb (132.5 kg), last menstrual period 11/12/2019. Body mass index is 45.73 kg/m.  General Appearance:    Alert, cooperative, no distress, appears stated age, morbid obesity  Head:    Normocephalic, without obvious abnormality, atraumatic  Eyes:    PERRL, conjunctiva/corneas clear, EOM's intact, both eyes  Ears:    Normal external ear canals, both ears  Nose:   Nares normal, septum midline, mucosa normal, no drainage or sinus tenderness  Throat:   Lips, mucosa, and tongue normal; teeth and gums normal  Neck:   Supple, symmetrical, trachea midline, no adenopathy; thyroid: no enlargement/tenderness/nodules; no carotid bruit or JVD  Back:     Symmetric,  no curvature, ROM normal, no CVA tenderness  Lungs:     Clear to auscultation bilaterally, respirations unlabored  Chest Wall:    No tenderness or deformity   Heart:    Regular rate and rhythm, S1 and S2 normal, no murmur, rub or gallop  Breast Exam:    No tenderness, masses, or nipple abnormality  Abdomen:     Soft, non-tender, bowel sounds active all four quadrants, no masses, no organomegaly.    Genitalia:    Pelvic:external genitalia normal, vagina without lesions, discharge, or tenderness, rectovaginal septum  normal. Cervix normal in appearance, no cervical motion tenderness, no adnexal masses or tenderness.  Uterus difficult to assess due to body habitus but feels normal size, shape, , regular contours, nontender.  Rectal:    Normal external sphincter.  No hemorrhoids appreciated. Internal exam not done.   Extremities:   Extremities normal, atraumatic, no  cyanosis or edema  Pulses:   2+ and symmetric all extremities  Skin:   Skin color, texture, turgor normal, no rashes or lesions. Small patch of fine hairs on sternum.  Fine chin hairs visible.  Lymph nodes:   Cervical, supraclavicular, and axillary nodes normal  Neurologic:   CNII-XII intact, normal strength, sensation and reflexes throughout   .  Labs:  Lab Results  Component Value Date   WBC 7.4 11/15/2011   HGB 13.5 11/15/2011   HCT 39.4 11/15/2011   MCV 93 11/15/2011   PLT 273 11/15/2011    Lab Results  Component Value Date   CREATININE 0.87 11/15/2011   BUN 9 11/15/2011   NA 138 11/15/2011   K 3.9 11/15/2011   CL 105 11/15/2011   CO2 26 (H) 11/15/2011    No results found for: ALT, AST, GGT, ALKPHOS, BILITOT  No results found for: TSH   Assessment:   1. Encounter for well woman exam with routine gynecological exam   2. Excessive hair growth   3. Irregular menses   4. Encounter for surveillance of contraceptive pills   5. Morbid obesity with BMI of 45.0-49.9, adult ([place])     Plan:     Blood tests: CBC  with diff, Comprehensive metabolic panel, Lipoproteins, TSH and Insulin, A1c, Testosterone, DHEA-S. Breast self exam technique reviewed and patient encouraged to perform self-exam monthly. Contraception: OCP (estrogen/progesterone). Discussed alternative methods for contraception that could better regulate cycles, however patient desires to remain on OCPs.  Will change from Junel to [place] (due to concerns for possible PCOS).  Discussed healthy lifestyle modifications. Pap smear up to date. Pelvic ultrasound ordered to assess for PCOS.   Increased hair growth, will assess for PCOS with labs and ultrasound. If OCPs do not help to manage, can consider use of spironolactone.  Flu vaccine declined.  Has received HPV vaccine series.  Follow up in 1-2 weeks to review labs and ultrasound. F/u in 1 year for annual exam.   Rubie Maid, MD [facility]

## 2019-11-29 LAB — COMPREHENSIVE METABOLIC PANEL
ALT: 20 IU/L (ref 0–32)
AST: 18 IU/L (ref 0–40)
Albumin/Globulin Ratio: 1.6 (ref 1.2–2.2)
Albumin: 4.4 g/dL (ref 3.9–5.0)
Alkaline Phosphatase: 100 IU/L (ref 39–117)
BUN/Creatinine Ratio: 8 — ABNORMAL LOW (ref 9–23)
BUN: 7 mg/dL (ref 6–20)
Bilirubin Total: 0.2 mg/dL (ref 0.0–1.2)
CO2: 24 mmol/L (ref 20–29)
Calcium: 8.9 mg/dL (ref 8.7–10.2)
Chloride: 100 mmol/L (ref 96–106)
Creatinine, Ser: 0.83 mg/dL (ref 0.57–1.00)
GFR calc Af Amer: 113 mL/min/{1.73_m2} (ref >59)
GFR calc non Af Amer: 98 mL/min/{1.73_m2} (ref >59)
Globulin, Total: 2.7 g/dL (ref 1.5–4.5)
Glucose: 87 mg/dL (ref 65–99)
Potassium: 4.4 mmol/L (ref 3.5–5.2)
Sodium: 138 mmol/L (ref 134–144)
Total Protein: 7.1 g/dL (ref 6.0–8.5)

## 2019-11-29 LAB — CBC WITH DIFFERENTIAL
Basophils Absolute: 0.1 10*3/uL (ref 0.0–0.2)
Basos: 1 %
EOS (ABSOLUTE): 0.2 10*3/uL (ref 0.0–0.4)
Eos: 3 %
Hematocrit: 42.9 % (ref 34.0–46.6)
Hemoglobin: 14.1 g/dL (ref 11.1–15.9)
Immature Grans (Abs): 0 10*3/uL (ref 0.0–0.1)
Immature Granulocytes: 0 %
Lymphocytes Absolute: 2.5 10*3/uL (ref 0.7–3.1)
Lymphs: 34 %
MCH: 30.4 pg (ref 26.6–33.0)
MCHC: 32.9 g/dL (ref 31.5–35.7)
MCV: 93 fL (ref 79–97)
Monocytes Absolute: 0.5 10*3/uL (ref 0.1–0.9)
Monocytes: 6 %
Neutrophils Absolute: 4 10*3/uL (ref 1.4–7.0)
Neutrophils: 56 %
RBC: 4.64 x10E6/uL (ref 3.77–5.28)
RDW: 11.6 % — ABNORMAL LOW (ref 11.7–15.4)
WBC: 7.2 10*3/uL (ref 3.4–10.8)

## 2019-11-29 LAB — LIPID PANEL
Chol/HDL Ratio: 3.2 ratio (ref 0.0–4.4)
Cholesterol, Total: 187 mg/dL (ref 100–199)
HDL: 58 mg/dL (ref >39)
LDL Chol Calc (NIH): 117 mg/dL — ABNORMAL HIGH (ref 0–99)
Triglycerides: 63 mg/dL (ref 0–149)
VLDL Cholesterol Cal: 12 mg/dL (ref 5–40)

## 2019-11-29 LAB — TESTOSTERONE, FREE, TOTAL, SHBG
Sex Hormone Binding: 33.6 nmol/L (ref 24.6–122.0)
Testosterone, Free: 3.3 pg/mL (ref 0.0–4.2)
Testosterone: 82 ng/dL — ABNORMAL HIGH (ref 8–48)

## 2019-11-29 LAB — TSH: TSH: 2.49 u[IU]/mL (ref 0.450–4.500)

## 2019-11-29 LAB — HEMOGLOBIN A1C
Est. average glucose Bld gHb Est-mCnc: 111 mg/dL
Hgb A1c MFr Bld: 5.5 % (ref 4.8–5.6)

## 2019-11-29 LAB — INSULIN, FREE AND TOTAL
Free Insulin: 10 uU/mL
Total Insulin: 10 uU/mL

## 2019-11-29 LAB — DHEA-SULFATE: DHEA-SO4: 182 ug/dL (ref 84.8–378.0)

## 2019-12-02 ENCOUNTER — Encounter: Payer: Self-pay | Admitting: Obstetrics and Gynecology

## 2019-12-02 ENCOUNTER — Other Ambulatory Visit: Payer: Self-pay

## 2019-12-02 ENCOUNTER — Ambulatory Visit (INDEPENDENT_AMBULATORY_CARE_PROVIDER_SITE_OTHER): Payer: 59

## 2019-12-02 ENCOUNTER — Ambulatory Visit (INDEPENDENT_AMBULATORY_CARE_PROVIDER_SITE_OTHER): Payer: 59 | Admitting: Obstetrics and Gynecology

## 2019-12-02 VITALS — BP 121/73 | HR 71 | Ht 67.0 in | Wt 289.5 lb

## 2019-12-02 DIAGNOSIS — N926 Irregular menstruation, unspecified: Secondary | ICD-10-CM | POA: Diagnosis not present

## 2019-12-02 DIAGNOSIS — R102 Pelvic and perineal pain: Secondary | ICD-10-CM | POA: Diagnosis not present

## 2019-12-02 DIAGNOSIS — L689 Hypertrichosis, unspecified: Secondary | ICD-10-CM | POA: Diagnosis not present

## 2019-12-02 DIAGNOSIS — E288 Other ovarian dysfunction: Secondary | ICD-10-CM | POA: Diagnosis not present

## 2019-12-02 DIAGNOSIS — Z01419 Encounter for gynecological examination (general) (routine) without abnormal findings: Secondary | ICD-10-CM

## 2019-12-02 NOTE — Progress Notes (Signed)
GYNECOLOGY PROGRESS NOTE  Subjective:    Patient ID: Melanie Frederick, female    DOB: 1991-09-20, 29 y.o.   MRN: [id number]  HPI  Patient is a 29 y.o. G0P0000 female who presents for follow up of ultrasound and labs.  Is undergoing evaluation of PCOS. Also with occasional pelvic pain.   The following portions of the patient's history were reviewed and updated as appropriate: allergies, current medications, past family history, past medical history, past social history, past surgical history and problem list.  Review of Systems Pertinent items noted in HPI and remainder of comprehensive ROS otherwise negative.   Objective:   Blood pressure 121/73, pulse 71, height 5\' 7"  (1.702 m), weight 289 lb 8 oz (131.3 kg), last menstrual period 11/12/2019. General appearance: alert and no distress Remainder of exam deferred.    Labs:  Office Visit on 11/17/2019  Component Date Value Ref Range Status  . WBC 11/17/2019 7.2  3.4 - 10.8 x10E3/uL Final  . RBC 11/17/2019 4.64  3.77 - 5.28 x10E6/uL Final  . Hemoglobin 11/17/2019 14.1  11.1 - 15.9 g/dL Final  . Hematocrit 11/17/2019 42.9  34.0 - 46.6 % Final  . MCV 11/17/2019 93  79 - 97 fL Final  . MCH 11/17/2019 30.4  26.6 - 33.0 pg Final  . MCHC 11/17/2019 32.9  31.5 - 35.7 g/dL Final  . RDW 11/17/2019 11.6* 11.7 - 15.4 % Final  . Neutrophils 11/17/2019 56  Not Estab. % Final  . Lymphs 11/17/2019 34  Not Estab. % Final  . Monocytes 11/17/2019 6  Not Estab. % Final  . Eos 11/17/2019 3  Not Estab. % Final  . Basos 11/17/2019 1  Not Estab. % Final  . Neutrophils Absolute 11/17/2019 4.0  1.4 - 7.0 x10E3/uL Final  . Lymphocytes Absolute 11/17/2019 2.5  0.7 - 3.1 x10E3/uL Final  . Monocytes Absolute 11/17/2019 0.5  0.1 - 0.9 x10E3/uL Final  . EOS (ABSOLUTE) 11/17/2019 0.2  0.0 - 0.4 x10E3/uL Final  . Basophils Absolute 11/17/2019 0.1  0.0 - 0.2 x10E3/uL Final  . Immature Granulocytes 11/17/2019 0  Not Estab. % Final  . Immature Grans (Abs)  11/17/2019 0.0  0.0 - 0.1 x10E3/uL Final  . TSH 11/17/2019 2.490  0.450 - 4.500 uIU/mL Final  . Free Insulin 11/17/2019 10  uU/mL Final   Comment: Reference Range: Pubertal Children and Adults (fasting): 0 - 17   . Total Insulin 11/17/2019 10  uU/mL Final   Comment: Non-Diabetic:  In the absence of insulin-binding antibodies, the free and total insulin assays are equivalent. However, this assay is intended for use in diabetics with insulin autoantibody present. Measurement is performed on acid-treated samples and, therefore, the sensitivity and absolute values by this method may differ from our direct insulin ICMA. Insulin Dependent Diabetic Patients:  Free Insulin levels vary depending on the capacity and affinity of circulating insulin-binding antibodies and the dose of insulin given to the patient.  Total insulin levels represent free insulin and antibody bound insulin fractions. This test was developed and its performance characteristics determined by [facility]. It has not been cleared or approved by the Food and Drug Administration.   Marland Kitchen DHEA-SO4 11/17/2019 182.0  84.8 - 378.0 ug/dL Final  . Testosterone 11/17/2019 82* 8 - 48 ng/dL Final  . Testosterone, Free 11/17/2019 3.3  0.0 - 4.2 pg/mL Final  . Sex Hormone Binding 11/17/2019 33.6  24.6 - 122.0 nmol/L Final  . Glucose 11/17/2019 87  65 - 99 mg/dL Final  .  BUN 11/17/2019 7  6 - 20 mg/dL Final  . Creatinine, Ser 11/17/2019 0.83  0.57 - 1.00 mg/dL Final  . GFR calc non Af Amer 11/17/2019 98  >59 mL/min/1.73 Final  . GFR calc Af Amer 11/17/2019 113  >59 mL/min/1.73 Final  . BUN/Creatinine Ratio 11/17/2019 8* 9 - 23 Final  . Sodium 11/17/2019 138  134 - 144 mmol/L Final  . Potassium 11/17/2019 4.4  3.5 - 5.2 mmol/L Final  . Chloride 11/17/2019 100  96 - 106 mmol/L Final  . CO2 11/17/2019 24  20 - 29 mmol/L Final  . Calcium 11/17/2019 8.9  8.7 - 10.2 mg/dL Final  . Total Protein 11/17/2019 7.1  6.0 - 8.5 g/dL Final  . Albumin  11/17/2019 4.4  3.9 - 5.0 g/dL Final  . Globulin, Total 11/17/2019 2.7  1.5 - 4.5 g/dL Final  . Albumin/Globulin Ratio 11/17/2019 1.6  1.2 - 2.2 Final  . Bilirubin Total 11/17/2019 0.2  0.0 - 1.2 mg/dL Final  . Alkaline Phosphatase 11/17/2019 100  39 - 117 IU/L Final  . AST 11/17/2019 18  0 - 40 IU/L Final  . ALT 11/17/2019 20  0 - 32 IU/L Final  . Hgb A1c MFr Bld 11/17/2019 5.5  4.8 - 5.6 % Final   Comment:          Prediabetes: 5.7 - 6.4          Diabetes: >6.4          Glycemic control for adults with diabetes: <7.0   . Est. average glucose Bld gHb Est-m* 11/17/2019 111  mg/dL Final  . Cholesterol, Total 11/17/2019 187  100 - 199 mg/dL Final  . Triglycerides 11/17/2019 63  0 - 149 mg/dL Final  . HDL 11/17/2019 58  >39 mg/dL Final  . VLDL Cholesterol Cal 11/17/2019 12  5 - 40 mg/dL Final  . LDL Chol Calc (NIH) 11/17/2019 117* 0 - 99 mg/dL Final  . Chol/HDL Ratio 11/17/2019 3.2  0.0 - 4.4 ratio Final   Comment:                                   T. Chol/HDL Ratio                                             Men  Women                               1/2 Avg.Risk  3.4    3.3                                   Avg.Risk  5.0    4.4                                2X Avg.Risk  9.6    7.1                                3X Avg.Risk 23.4   11.0     Imaging:  Patient Name: Melanie Frederick DOB: June 22, 1991  MRN: [id number] ULTRASOUND REPORT  Location: [facility]  Date of Service: 12/02/2019    Indications:Pelvic Pain   Findings:  The uterus is anteverted and measures 6.2 x 2.7 x 4.9 cm.. Echo texture is homogenous without evidence of focal masses.  The Endometrium measures 8 mm.  Right Ovary measures 2.3 x 1.4 x 2.2 cm. It is normal in appearance. Left Ovary measures 3.5 x 20 x 2.5 cm. It is normal in appearance. Survey of the adnexa demonstrates no adnexal masses. There is no free fluid in the cul de sac.  Impression: 1. Pelvic ultrasound appears to be WNL at this  time.  Recommendations: 1.Clinical correlation with the patient's History and Physical Exam.   Jenine M. Albertine Grates    RDMS  Assessment:   Hyperandrogenism Hirsutism Pelvic pain (right side, intermittent)  Plan:   - No evidence of PCOS on labs and ultrasound except hyperandrogenism.  Patient recently changed from [place] to [employer] to help with hyperandrogenism and hair growth. Discussed that if symptoms don't improve after 3 months, can consider addition of medication such as Spironolactone.  - No findings for intermittent pelvic pain, discussed that if it becomes more persistent, can consider referral for further workup to other specialties such as GI, as pelvic ultrasound with normal findings.   Return to clinic for any scheduled appointments or for any gynecologic concerns as needed.     A total of 15 minutes were spent face-to-face with the patient during this encounter and over half of that time dealt with counseling and coordination of care.  Rubie Maid, MD [facility]

## 2019-12-02 NOTE — Progress Notes (Signed)
Pt present for u/s follow up and test results. Pt stated that she was doing well no problems.

## 2020-04-06 NOTE — Telephone Encounter (Signed)
Pt called in and stated that she wanted to know what the status of her paper that she dropped off Friday was. The pt stated she was told it would be put in the providers box to sign. The pt is requesting a call. Please advise

## 2020-04-15 MED ORDER — NORETHIN-ETH ESTRAD-FE BIPHAS 1 MG-10 MCG / 10 MCG PO TABS: 1.0000 | ORAL_TABLET | Freq: Every day | ORAL | 3 refills | Status: DC

## 2020-04-26 ENCOUNTER — Ambulatory Visit: Payer: Self-pay | Admitting: Physician Assistant

## 2020-04-26 ENCOUNTER — Other Ambulatory Visit: Payer: Self-pay

## 2020-04-26 ENCOUNTER — Encounter: Payer: Self-pay | Admitting: Physician Assistant

## 2020-04-26 VITALS — BP 108/72 | HR 92 | Temp 98.4°F | Resp 16 | Ht 67.0 in | Wt 274.0 lb

## 2020-04-26 DIAGNOSIS — H6121 Impacted cerumen, right ear: Secondary | ICD-10-CM

## 2020-04-26 NOTE — Progress Notes (Signed)
Pt stated two days ago a fly flew in her ear that she could hearing buzzing but doesn't know if it ever came out. Pt states she's been feeling pressure ever since. CL,RMA

## 2020-04-26 NOTE — Progress Notes (Signed)
° °  Subjective: Foreign body right ear    Patient ID: Melanie Frederick, female    DOB: 1991-06-29, 29 y.o.   MRN: [id number]  HPI Pain suspect foreign body right ear secondary to above Friday the ear 2 days ago.  Patient said initially it was "buzzing" but she no longer feels that sensation.  Denies hearing loss or vertigo.   Review of Systems Negative except for complaint    Objective:   Physical Exam No acute distress.  No visible foreign body small amount of cerumen in the right ear canal.       Assessment & Plan: cerumen right ear canal  Irrigated right ear with removal cerumen.  No other visible foreign body.  Follow-up if condition persist.

## 2020-05-17 DIAGNOSIS — E663 Overweight: Secondary | ICD-10-CM | POA: Insufficient documentation

## 2020-05-18 DIAGNOSIS — D2239 Melanocytic nevi of other parts of face: Secondary | ICD-10-CM | POA: Diagnosis not present

## 2020-05-18 DIAGNOSIS — L578 Other skin changes due to chronic exposure to nonionizing radiation: Secondary | ICD-10-CM | POA: Diagnosis not present

## 2020-05-18 DIAGNOSIS — D485 Neoplasm of uncertain behavior of skin: Secondary | ICD-10-CM | POA: Diagnosis not present

## 2020-05-20 ENCOUNTER — Other Ambulatory Visit: Payer: Self-pay

## 2020-05-20 ENCOUNTER — Encounter: Payer: Self-pay | Admitting: Obstetrics and Gynecology

## 2020-05-20 ENCOUNTER — Ambulatory Visit (INDEPENDENT_AMBULATORY_CARE_PROVIDER_SITE_OTHER): Payer: 59 | Admitting: Obstetrics and Gynecology

## 2020-05-20 VITALS — BP 112/80 | HR 73 | Ht 67.0 in | Wt 276.9 lb

## 2020-05-20 DIAGNOSIS — Z7689 Persons encountering health services in other specified circumstances: Secondary | ICD-10-CM | POA: Diagnosis not present

## 2020-05-20 DIAGNOSIS — E288 Other ovarian dysfunction: Secondary | ICD-10-CM | POA: Diagnosis not present

## 2020-05-20 DIAGNOSIS — Z6841 Body Mass Index (BMI) 40.0 and over, adult: Secondary | ICD-10-CM

## 2020-05-20 MED ORDER — PHENTERMINE HCL 37.5 MG PO CAPS: 37.5000 mg | ORAL_CAPSULE | ORAL | 0 refills | Status: DC

## 2020-05-20 MED ORDER — PHENTERMINE HCL 15 MG PO CAPS: 15.0000 mg | ORAL_CAPSULE | ORAL | 0 refills | Status: DC

## 2020-05-20 NOTE — Patient Instructions (Signed)
Exercising to Lose Weight Exercise is structured, repetitive physical activity to improve fitness and health. Getting regular exercise is important for everyone. It is especially important if you are overweight. Being overweight increases your risk of heart disease, stroke, diabetes, high blood pressure, and several types of cancer. Reducing your calorie intake and exercising can help you lose weight. Exercise is usually categorized as moderate or vigorous intensity. To lose weight, most people need to do a certain amount of moderate-intensity or vigorous-intensity exercise each week. Moderate-intensity exercise  Moderate-intensity exercise is any activity that gets you moving enough to burn at least three times more energy (calories) than if you were sitting. Examples of moderate exercise include:  Walking a mile in 15 minutes.  Doing light yard work.  Biking at an easy pace. Most people should get at least 150 minutes (2 hours and 30 minutes) a week of moderate-intensity exercise to maintain their body weight. Vigorous-intensity exercise Vigorous-intensity exercise is any activity that gets you moving enough to burn at least six times more calories than if you were sitting. When you exercise at this intensity, you should be working hard enough that you are not able to carry on a conversation. Examples of vigorous exercise include:  Running.  Playing a team sport, such as football, basketball, and soccer.  Jumping rope. Most people should get at least 75 minutes (1 hour and 15 minutes) a week of vigorous-intensity exercise to maintain their body weight. How can exercise affect me? When you exercise enough to burn more calories than you eat, you lose weight. Exercise also reduces body fat and builds muscle. The more muscle you have, the more calories you burn. Exercise also:  Improves mood.  Reduces stress and tension.  Improves your overall fitness, flexibility, and  endurance.  Increases bone strength. The amount of exercise you need to lose weight depends on:  Your age.  The type of exercise.  Any health conditions you have.  Your overall physical ability. Talk to your health care provider about how much exercise you need and what types of activities are safe for you. What actions can I take to lose weight? Nutrition   Make changes to your diet as told by your health care provider or diet and nutrition specialist (dietitian). This may include: ? Eating fewer calories. ? Eating more protein. ? Eating less unhealthy fats. ? Eating a diet that includes fresh fruits and vegetables, whole grains, low-fat dairy products, and lean protein. ? Avoiding foods with added fat, salt, and sugar.  Drink plenty of water while you exercise to prevent dehydration or heat stroke. Activity  Choose an activity that you enjoy and set realistic goals. Your health care provider can help you make an exercise plan that works for you.  Exercise at a moderate or vigorous intensity most days of the week. ? The intensity of exercise may vary from person to person. You can tell how intense a workout is for you by paying attention to your breathing and heartbeat. Most people will notice their breathing and heartbeat get faster with more intense exercise.  Do resistance training twice each week, such as: ? Push-ups. ? Sit-ups. ? Lifting weights. ? Using resistance bands.  Getting short amounts of exercise can be just as helpful as long structured periods of exercise. If you have trouble finding time to exercise, try to include exercise in your daily routine. ? Get up, stretch, and walk around every 30 minutes throughout the day. ? Go for a   walk during your lunch break. ? Park your car farther away from your destination. ? If you take public transportation, get off one stop early and walk the rest of the way. ? Make phone calls while standing up and walking  around. ? Take the stairs instead of elevators or escalators.  Wear comfortable clothes and shoes with good support.  Do not exercise so much that you hurt yourself, feel dizzy, or get very short of breath. Where to find more information  [place] Department of Health and Human Services: www.hhs.gov  Centers for Disease Control and Prevention (CDC): www.cdc.gov Contact a health care provider:  Before starting a new exercise program.  If you have questions or concerns about your weight.  If you have a medical problem that keeps you from exercising. Get help right away if you have any of the following while exercising:  Injury.  Dizziness.  Difficulty breathing or shortness of breath that does not go away when you stop exercising.  Chest pain.  Rapid heartbeat. Summary  Being overweight increases your risk of heart disease, stroke, diabetes, high blood pressure, and several types of cancer.  Losing weight happens when you burn more calories than you eat.  Reducing the amount of calories you eat in addition to getting regular moderate or vigorous exercise each week helps you lose weight. This information is not intended to replace advice given to you by your health care provider. Make sure you discuss any questions you have with your health care provider. Document Revised: 10/07/2017 Document Reviewed: 10/07/2017 Elsevier Patient Education  2020 [employer] Calorie Counting for Weight Loss Calories are units of energy. Your body needs a certain amount of calories from food to keep you going throughout the day. When you eat more calories than your body needs, your body stores the extra calories as fat. When you eat fewer calories than your body needs, your body burns fat to get the energy it needs. Calorie counting means keeping track of how many calories you eat and drink each day. Calorie counting can be helpful if you need to lose weight. If you make sure to eat fewer calories  than your body needs, you should lose weight. Ask your health care provider what a healthy weight is for you. For calorie counting to work, you will need to eat the right number of calories in a day in order to lose a healthy amount of weight per week. A dietitian can help you determine how many calories you need in a day and will give you suggestions on how to reach your calorie goal.  A healthy amount of weight to lose per week is usually 1-2 lb (0.5-0.9 kg). This usually means that your daily calorie intake should be reduced by 500-750 calories.  Eating 1,200 - 1,500 calories per day can help most women lose weight.  Eating 1,500 - 1,800 calories per day can help most men lose weight. What is my plan? My goal is to have __________ calories per day. If I have this many calories per day, I should lose around __________ pounds per week. What do I need to know about calorie counting? In order to meet your daily calorie goal, you will need to:  Find out how many calories are in each food you would like to eat. Try to do this before you eat.  Decide how much of the food you plan to eat.  Write down what you ate and how many calories it had. Doing this   is called keeping a food log. To successfully lose weight, it is important to balance calorie counting with a healthy lifestyle that includes regular activity. Aim for 150 minutes of moderate exercise (such as walking) or 75 minutes of vigorous exercise (such as running) each week. Where do I find calorie information?  The number of calories in a food can be found on a Nutrition Facts label. If a food does not have a Nutrition Facts label, try to look up the calories online or ask your dietitian for help. Remember that calories are listed per serving. If you choose to have more than one serving of a food, you will have to multiply the calories per serving by the amount of servings you plan to eat. For example, the label on a package of bread might  say that a serving size is 1 slice and that there are 90 calories in a serving. If you eat 1 slice, you will have eaten 90 calories. If you eat 2 slices, you will have eaten 180 calories. How do I keep a food log? Immediately after each meal, record the following information in your food log:  What you ate. Don't forget to include toppings, sauces, and other extras on the food.  How much you ate. This can be measured in cups, ounces, or number of items.  How many calories each food and drink had.  The total number of calories in the meal. Keep your food log near you, such as in a small notebook in your pocket, or use a mobile app or website. Some programs will calculate calories for you and show you how many calories you have left for the day to meet your goal. What are some calorie counting tips?   Use your calories on foods and drinks that will fill you up and not leave you hungry: ? Some examples of foods that fill you up are nuts and nut butters, vegetables, lean proteins, and high-fiber foods like whole grains. High-fiber foods are foods with more than 5 g fiber per serving. ? Drinks such as sodas, specialty coffee drinks, alcohol, and juices have a lot of calories, yet do not fill you up.  Eat nutritious foods and avoid empty calories. Empty calories are calories you get from foods or beverages that do not have many vitamins or protein, such as candy, sweets, and soda. It is better to have a nutritious high-calorie food (such as an avocado) than a food with few nutrients (such as a bag of chips).  Know how many calories are in the foods you eat most often. This will help you calculate calorie counts faster.  Pay attention to calories in drinks. Low-calorie drinks include water and unsweetened drinks.  Pay attention to nutrition labels for "low fat" or "fat free" foods. These foods sometimes have the same amount of calories or more calories than the full fat versions. They also often  have added sugar, starch, or salt, to make up for flavor that was removed with the fat.  Find a way of tracking calories that works for you. Get creative. Try different apps or programs if writing down calories does not work for you. What are some portion control tips?  Know how many calories are in a serving. This will help you know how many servings of a certain food you can have.  Use a measuring cup to measure serving sizes. You could also try weighing out portions on a kitchen scale. With time, you will be able   to estimate serving sizes for some foods.  Take some time to put servings of different foods on your favorite plates, bowls, and cups so you know what a serving looks like.  Try not to eat straight from a bag or box. Doing this can lead to overeating. Put the amount you would like to eat in a cup or on a plate to make sure you are eating the right portion.  Use smaller plates, glasses, and bowls to prevent overeating.  Try not to multitask (for example, watch TV or use your computer) while eating. If it is time to eat, sit down at a table and enjoy your food. This will help you to know when you are full. It will also help you to be aware of what you are eating and how much you are eating. What are tips for following this plan? Reading food labels  Check the calorie count compared to the serving size. The serving size may be smaller than what you are used to eating.  Check the source of the calories. Make sure the food you are eating is high in vitamins and protein and low in saturated and trans fats. Shopping  Read nutrition labels while you shop. This will help you make healthy decisions before you decide to purchase your food.  Make a grocery list and stick to it. Cooking  Try to cook your favorite foods in a healthier way. For example, try baking instead of frying.  Use low-fat dairy products. Meal planning  Use more fruits and vegetables. Half of your plate should be  fruits and vegetables.  Include lean proteins like poultry and fish. How do I count calories when eating out?  Ask for smaller portion sizes.  Consider sharing an entree and sides instead of getting your own entree.  If you get your own entree, eat only half. Ask for a box at the beginning of your meal and put the rest of your entree in it so you are not tempted to eat it.  If calories are listed on the menu, choose the lower calorie options.  Choose dishes that include vegetables, fruits, whole grains, low-fat dairy products, and lean protein.  Choose items that are boiled, broiled, grilled, or steamed. Stay away from items that are buttered, battered, fried, or served with cream sauce. Items labeled "crispy" are usually fried, unless stated otherwise.  Choose water, low-fat milk, unsweetened iced tea, or other drinks without added sugar. If you want an alcoholic beverage, choose a lower calorie option such as a glass of wine or light beer.  Ask for dressings, sauces, and syrups on the side. These are usually high in calories, so you should limit the amount you eat.  If you want a salad, choose a garden salad and ask for grilled meats. Avoid extra toppings like bacon, cheese, or fried items. Ask for the dressing on the side, or ask for olive oil and vinegar or lemon to use as dressing.  Estimate how many servings of a food you are given. For example, a serving of cooked rice is  cup or about the size of half a baseball. Knowing serving sizes will help you be aware of how much food you are eating at restaurants. The list below tells you how big or small some common portion sizes are based on everyday objects: ? 1 oz--4 stacked dice. ? 3 oz--1 deck of cards. ? 1 tsp--1 die. ? 1 Tbsp-- a ping-pong ball. ? 2 Tbsp--1 ping-pong ball. ?    cup-- baseball. ? 1 cup--1 baseball. Summary  Calorie counting means keeping track of how many calories you eat and drink each day. If you eat fewer  calories than your body needs, you should lose weight.  A healthy amount of weight to lose per week is usually 1-2 lb (0.5-0.9 kg). This usually means reducing your daily calorie intake by 500-750 calories.  The number of calories in a food can be found on a Nutrition Facts label. If a food does not have a Nutrition Facts label, try to look up the calories online or ask your dietitian for help.  Use your calories on foods and drinks that will fill you up, and not on foods and drinks that will leave you hungry.  Use smaller plates, glasses, and bowls to prevent overeating. This information is not intended to replace advice given to you by your health care provider. Make sure you discuss any questions you have with your health care provider. Document Revised: 06/13/2018 Document Reviewed: 08/24/2016 Elsevier Patient Education  2020 [employer]  

## 2020-05-20 NOTE — Progress Notes (Signed)
   GYNECOLOGY CLINIC PROGRESS NOTE Subjective:     Melanie Frederick is a 29 y.o. G0P0000 female here for discussion regarding weight loss. She has noted a weight gain of approximately 30 pounds over the last 1.5 years. She feels ideal weight is 200 pounds. Weight at graduation from high school was 190 pounds. History of eating disorders: none. There is a family history positive for obesity in the mother and father. Previous treatments for obesity include weight loss supplement- Octavia and prescription appetite suppressants: Phentermine. Obesity associated medical conditions: hyperandrogenism (possible PCOS). Obesity associated medications: none. Cardiovascular risk factors besides obesity: none.  Treatments to date: [employer], but began causing gallbladder issues, stopped this past month, was only on it for 2 weeks.  Has started working out (treadmill, elliptical, some strength training) 1-3 times per week depending on j. Has sedentary job, works 12 hr shifts. Has lost 6 lbs. Notes she has taken Phentermine in college once before, lost 60 lbs. Bowel movements normal. Drinks ~ 64 oz per day.    The following portions of the patient's history were reviewed and updated as appropriate: allergies, current medications, past family history, past medical history, past social history, past surgical history and problem list.     Review of Systems Pertinent items noted in HPI and remainder of comprehensive ROS otherwise negative.    Objective:  Blood pressure 112/80, pulse 73, height 5\' 7"  (1.702 m), weight 276 lb 14.4 oz (125.6 kg), last menstrual period 05/15/2020. Body mass index is 43.37 kg/m.  BP 112/80   Pulse 73   Ht 5\' 7"  (1.702 m)   Wt 276 lb 14.4 oz (125.6 kg)   LMP 05/15/2020   BMI 43.37 kg/m  General appearance: alert and no distress  Neck: no thyromegaly or masses Lungs: clear to auscultation bilaterally Heart: regular rate and rhythm, S1, S2 normal, no murmur, click, rub  or gallop Abdomen: soft, non-tender; bowel sounds normal; no masses,  no organomegaly. BMI 46 in.  Assessment:   Obesity. I assessed Melanie Frederick to be in an action stage with respect to weight loss.  Hyperandrogenism  Plan:   General weight loss/lifestyle modification strategies discussed (elicit support from others; identify saboteurs; non-food rewards, etc). Diet interventions: high protein/low carb diet. Informal exercise measures discussed, e.g. taking stairs instead of elevator. Regular aerobic exercise program discussed. Medication: phentermine. Labs: will check Vitamin D and B12 levels as patient notes h/o B12 deficiency in the past.  Follow up in: 4 weeks for weight check.    A total of 15 minutes were spent face-to-face with the patient during this encounter and over half of that time dealt with counseling and coordination of care.  Rubie Maid, MD [facility]

## 2020-05-20 NOTE — Progress Notes (Signed)
Pt present to discuss weight management and concerned about her current wt. Pt's last visit 12/02/2019 wt 289 lb. Pt stated that she has tried exercising and food dieting and noticed no changes. Pt stated that she exercises 1 week 2-3 days and the next week 1-2 days for about an hour each day.

## 2020-05-21 LAB — VITAMIN B12: Vitamin B-12: 598 pg/mL (ref 232–1245)

## 2020-06-20 DIAGNOSIS — D235 Other benign neoplasm of skin of trunk: Secondary | ICD-10-CM | POA: Diagnosis not present

## 2020-06-21 ENCOUNTER — Encounter: Payer: 59 | Admitting: Obstetrics and Gynecology

## 2020-06-23 ENCOUNTER — Encounter: Payer: 59 | Admitting: Obstetrics and Gynecology

## 2020-06-28 DIAGNOSIS — J029 Acute pharyngitis, unspecified: Secondary | ICD-10-CM | POA: Diagnosis not present

## 2020-06-28 DIAGNOSIS — Z03818 Encounter for observation for suspected exposure to other biological agents ruled out: Secondary | ICD-10-CM | POA: Diagnosis not present

## 2020-06-29 NOTE — Progress Notes (Signed)
Pt present for weight management. Pt's last visit 05/20/2020 wt 276 lbs.  Pt stated that she was doing well and denies any side effects from the medication phentermine.

## 2020-06-30 ENCOUNTER — Encounter: Payer: Self-pay | Admitting: Obstetrics and Gynecology

## 2020-06-30 ENCOUNTER — Ambulatory Visit (INDEPENDENT_AMBULATORY_CARE_PROVIDER_SITE_OTHER): Payer: 59 | Admitting: Obstetrics and Gynecology

## 2020-06-30 ENCOUNTER — Other Ambulatory Visit: Payer: Self-pay

## 2020-06-30 VITALS — BP 114/82 | HR 106 | Ht 67.0 in | Wt 257.3 lb

## 2020-06-30 DIAGNOSIS — Z7689 Persons encountering health services in other specified circumstances: Secondary | ICD-10-CM | POA: Diagnosis not present

## 2020-06-30 DIAGNOSIS — Z6841 Body Mass Index (BMI) 40.0 and over, adult: Secondary | ICD-10-CM

## 2020-06-30 MED ORDER — PHENTERMINE HCL 37.5 MG PO CAPS: 37.5000 mg | ORAL_CAPSULE | ORAL | 1 refills | Status: DC

## 2020-06-30 NOTE — Progress Notes (Signed)
    GYNECOLOGY PROGRESS NOTE  Subjective:    Patient ID: Melanie Frederick, female    DOB: 21-May-1991, 29 y.o.   MRN: [id number]  HPI  Patient is a 29 y.o. female who presents for 1 month weight management follow up. She initiated use of Phentermine 1 months ago.  Denies any undesirable side effects and reports compliance with medications.    Current interventions:  1. Diet - eating more fresh fruits/veggies, lean meats (tuna, chicken).  Water intake good (~ 5 or more bottle per day)  2. Activity - usually (treadmill, elliptical, some strength training) 1-3 times per week, however recently had a minor procedure done and was told to limit activities for 2 weeks.  3. Reports bowel movements are regular.    The following portions of the patient's history were reviewed and updated as appropriate: allergies, current medications, past family history, past medical history, past social history, past surgical history and problem list.  Review of Systems Pertinent items noted in HPI and remainder of comprehensive ROS otherwise negative.   Objective:    Vitals with BMI 06/30/2020 05/20/2020 04/26/2020  Height 5\' 7"  5\' 7"  5\' 7"   Weight 257 lbs 5 oz 276 lbs 14 oz 274 lbs  BMI 40.29 28.63 81.7  Systolic 711 657 903  Diastolic 82 80 72  Pulse 833 73 92    General appearance: alert and no distress  Heart: S1 and S2 present, mildly tachycardia Lungs: CTAB Abdomen: soft, non-tender.  Waist circumference 43 in.    Labs:  No new labs Assessment:   Weight management Obesity, Body mass index is 40.3 kg/m.  Plan:   1. Patient doing well on medications no major issues or side effects. Mild tachycardia noted on today's exam but not bothersome to patient. Has had weight loss of 19 lbs in 1 month.  Can continue medication as doing well. Continue current diet and exercise regimen. Can f/u in 2 months.

## 2020-09-07 ENCOUNTER — Encounter: Payer: 59 | Admitting: Obstetrics and Gynecology

## 2020-09-08 ENCOUNTER — Other Ambulatory Visit: Payer: Self-pay

## 2020-09-08 ENCOUNTER — Encounter: Payer: Self-pay | Admitting: Obstetrics and Gynecology

## 2020-09-08 ENCOUNTER — Ambulatory Visit (INDEPENDENT_AMBULATORY_CARE_PROVIDER_SITE_OTHER): Payer: 59 | Admitting: Obstetrics and Gynecology

## 2020-09-08 VITALS — BP 117/88 | HR 91 | Ht 67.0 in | Wt 247.1 lb

## 2020-09-08 DIAGNOSIS — Z7689 Persons encountering health services in other specified circumstances: Secondary | ICD-10-CM

## 2020-09-08 DIAGNOSIS — E669 Obesity, unspecified: Secondary | ICD-10-CM | POA: Diagnosis not present

## 2020-09-08 MED ORDER — PHENTERMINE HCL 37.5 MG PO CAPS: 37.5000 mg | ORAL_CAPSULE | ORAL | 1 refills | Status: DC

## 2020-09-08 NOTE — Progress Notes (Signed)
    GYNECOLOGY PROGRESS NOTE  Subjective:    Patient ID: Melanie Frederick, female    DOB: [birth year], 29 y.o.   MRN: [id number]  HPI  Patient is a 29 y.o. female who presents for 2 month weight management follow up. She initiated use of Phentermine 3 months ago.  Denies any undesirable side effects and reports compliance with medications.    Current interventions:  1. Diet - Patient notes that her diet has slacked a little since her last visit.  Still overall trying to eat healthy, however experienced a breakup with her boyfriend recently and had to move back in with her parents.  Notes that they keep a lot of snacks in the home due to her 57 year old sister.  Is working on being more mindful.  Water intake still good at at least 5 bottles of water per day.  2. Activity - usually (treadmill, elliptical, some strength training) 1-3 times per week, however reports a recent fall (tripped over a lamp and thinks she broke her 5th right toe, did not get it evaluated but notes that it was painful to walk for 2-3 weeks).  Is starting to feel better and so plans to resume exercising.  Still walks sometimes.  3. Reports bowel movements are regular.    The following portions of the patient's history were reviewed and updated as appropriate: allergies, current medications, past family history, past medical history, past social history, past surgical history and problem list.  Review of Systems Pertinent items noted in HPI and remainder of comprehensive ROS otherwise negative.   Objective:    Vitals with BMI 09/08/2020 06/30/2020 05/20/2020  Height 5\' 7"  5\' 7"  5\' 7"   Weight 247 lbs 2 oz 257 lbs 5 oz 276 lbs 14 oz  BMI 38.69 93.81 01.75  Systolic 102 585 277  Diastolic 88 82 80  Pulse 91 106 73    General appearance: alert and no distress  Heart: S1 and S2 present, mildly tachycardia Lungs: CTAB Abdomen: soft, non-tender.  Waist circumference 42 3/4 in.    Labs:  No new labs Assessment:    Weight management Obesity, Body mass index is 38.7 kg/m.  Plan:   1. Patient doing well on medications no major issues or side effects. Despite recent life stressors, patient has still managed to lose an additional 10 lbs over the past 2 months.  TWG so far is 29 lbs in 3 months. BMI has decreased from 43 to 38.  Goal is to get under 36 with medications. Can continue medication as she doing well. Continue current diet and exercise regimen. Can f/u in 2 months. This will be final refill and patient will need to take a hiatus from the medication after this.      Rubie Maid, MD [facility]

## 2020-09-08 NOTE — Progress Notes (Signed)
Pt present for weight check. Pt stated that she was doing well. Pt's last a visit 06/30/2020 wt 257lb.

## 2020-10-11 NOTE — Telephone Encounter (Signed)
Patient called in stating that she was having some lower pelvic pain. Patient is COVID +, informed her that we would be unable to see her in the office that she could go to the ED. Patient stated she cant afford the ED. Patient would like to know if there is anywhere that will see her with her having COVID. Informed patient that I would send a message back to her provider however we were operating on a 2 hour delay and her provider is with patients, informed patient to allow 24-72 hours for her providers nurse to get back in touch.  Could you please advise?  Thank you!  Tabitha

## 2020-10-13 ENCOUNTER — Emergency Department
Admission: EM | Admit: 2020-10-13 | Discharge: 2020-10-13 | Disposition: A | Discharge status: Home / Self Care | Payer: 59 | Attending: Emergency Medicine | Admitting: Emergency Medicine

## 2020-10-13 ENCOUNTER — Other Ambulatory Visit: Payer: Self-pay

## 2020-10-13 ENCOUNTER — Emergency Department: Payer: 59

## 2020-10-13 DIAGNOSIS — R1031 Right lower quadrant pain: Secondary | ICD-10-CM | POA: Diagnosis not present

## 2020-10-13 DIAGNOSIS — R109 Unspecified abdominal pain: Secondary | ICD-10-CM

## 2020-10-13 DIAGNOSIS — U071 COVID-19: Secondary | ICD-10-CM | POA: Diagnosis not present

## 2020-10-13 DIAGNOSIS — R102 Pelvic and perineal pain: Secondary | ICD-10-CM

## 2020-10-13 DIAGNOSIS — R10813 Right lower quadrant abdominal tenderness: Secondary | ICD-10-CM | POA: Insufficient documentation

## 2020-10-13 LAB — URINALYSIS, COMPLETE (UACMP) WITH MICROSCOPIC
Bacteria, UA: NONE SEEN
Bilirubin Urine: NEGATIVE
Glucose, UA: NEGATIVE mg/dL
Ketones, ur: NEGATIVE mg/dL
Nitrite: NEGATIVE
Protein, ur: 30 mg/dL — AB
Specific Gravity, Urine: 1.026 (ref 1.005–1.030)
Squamous Epithelial / HPF: >50 — ABNORMAL HIGH (ref 0–5)
pH: 5 (ref 5.0–8.0)

## 2020-10-13 LAB — CBC
HCT: 44.6 % (ref 36.0–46.0)
Hemoglobin: 14.6 g/dL (ref 12.0–15.0)
MCH: 30.6 pg (ref 26.0–34.0)
MCHC: 32.7 g/dL (ref 30.0–36.0)
MCV: 93.5 fL (ref 80.0–100.0)
Platelets: 312 10*3/uL (ref 150–400)
RBC: 4.77 MIL/uL (ref 3.87–5.11)
RDW: 12.1 % (ref 11.5–15.5)
WBC: 6.4 10*3/uL (ref 4.0–10.5)
nRBC: 0 % (ref 0.0–0.2)

## 2020-10-13 LAB — COMPREHENSIVE METABOLIC PANEL
ALT: 15 U/L (ref 0–44)
AST: 19 U/L (ref 15–41)
Albumin: 3.7 g/dL (ref 3.5–5.0)
Alkaline Phosphatase: 78 U/L (ref 38–126)
Anion gap: 7 (ref 5–15)
BUN: 11 mg/dL (ref 6–20)
CO2: 24 mmol/L (ref 22–32)
Calcium: 9 mg/dL (ref 8.9–10.3)
Chloride: 109 mmol/L (ref 98–111)
Creatinine, Ser: 0.58 mg/dL (ref 0.44–1.00)
GFR, Estimated: >60 mL/min (ref >60)
Glucose, Bld: 107 mg/dL — ABNORMAL HIGH (ref 70–99)
Potassium: 4.1 mmol/L (ref 3.5–5.1)
Sodium: 140 mmol/L (ref 135–145)
Total Bilirubin: 0.5 mg/dL (ref 0.3–1.2)
Total Protein: 7.2 g/dL (ref 6.5–8.1)

## 2020-10-13 LAB — LIPASE, BLOOD: Lipase: 46 U/L (ref 11–51)

## 2020-10-13 LAB — POC URINE PREG, ED: Preg Test, Ur: NEGATIVE

## 2020-10-13 MED ORDER — DICYCLOMINE HCL 10 MG PO CAPS
10.0000 mg | ORAL_CAPSULE | Freq: Once | ORAL | Status: AC
  Administered 2020-10-13: 10 mg via ORAL
  Filled 2020-10-13: qty 1

## 2020-10-13 MED ORDER — IOHEXOL 300 MG/ML  SOLN
100.0000 mL | Freq: Once | INTRAMUSCULAR | Status: AC | PRN
  Administered 2020-10-13: 100 mL via INTRAVENOUS
  Filled 2020-10-13: qty 100

## 2020-10-13 MED ORDER — DICYCLOMINE HCL 10 MG PO CAPS: 10.0000 mg | ORAL_CAPSULE | Freq: Three times a day (TID) | ORAL | 0 refills | Status: DC | PRN

## 2020-10-13 NOTE — ED Provider Notes (Signed)
[facility] Emergency Department Provider Note   ____________________________________________   I have reviewed the triage vital signs and the nursing notes.   HISTORY  Chief Complaint Abdominal Pain   History limited by: Not Limited   HPI Melanie Frederick is a 30 y.o. female who presents to the emergency department today because of concern for abdominal pain. The patient states that she has been having abdominal pain for the past 3 days. Located in the right lower quadrant. It has not moved around. The patient describes it as a sharp pain that is worse with movement. She has not had any associated nausea, vomiting or diarrhea. Did recently test positive for covid. Her symptoms have been some congestion and change in sense of smell and taste. The patient denies any fevers. Denies any abnormal vaginal discharge. Denies any change to urination or defecation.  Records reviewed.   Past Medical History:  Diagnosis Date  . Fatigue   . Nevus   . Vaccine for human papilloma virus (HPV) types 6, 11, 16, and 18 administered     Patient Active Problem List   Diagnosis Date Noted  . Morbid obesity with BMI of 40.0-44.9, adult (HCC) 06/30/2020  . Overweight 05/17/2020    Past Surgical History:  Procedure Laterality Date  . ARM SKIN LESION BIOPSY / EXCISION Left 2012  . LYMPH NODE DISSECTION Right 2011  . mold removal Left    moval removal off of back    Prior to Admission medications   Medication Sig Start Date End Date Taking? Authorizing Provider  albuterol (VENTOLIN HFA) 108 (90 Base) MCG/ACT inhaler  01/14/20   [provider]  phentermine 37.5 MG capsule Take 1 capsule (37.5 mg total) by mouth every morning. 09/08/20   Hildred Laser, MD    Allergies Mushroom extract complex and Prednisone  Family History  Problem Relation Age of Onset  . Hypothyroidism Paternal Grandmother   . Brain cancer Other        malignant, great mat uncle-60  .  Cancer Other 60       colon cancer  . Kidney disease Mother   . Anemia Mother     Social History Social History   Tobacco Use  . Smoking status: Never Smoker  . Smokeless tobacco: Never Used  Vaping Use  . Vaping Use: Never used  Substance Use Topics  . Alcohol use: Yes    Comment: occasinally   . Drug use: No    Review of Systems Constitutional: No fever/chills Eyes: No visual changes. ENT: Positive for congestion. Cardiovascular: Denies chest pain. Respiratory: Denies shortness of breath. Gastrointestinal: Positive for right lower quadrant pain.  Genitourinary: Negative for dysuria. Musculoskeletal: Negative for back pain. Skin: Negative for rash. Neurological: Positive for change in taste and small ____________________________________________   PHYSICAL EXAM:  VITAL SIGNS: ED Triage Vitals  Enc Vitals Group     BP 10/13/20 1509 126/80     Pulse Rate 10/13/20 1509 95     Resp 10/13/20 1509 18     Temp 10/13/20 1509 98.7 F (37.1 C)     Temp Source 10/13/20 1509 Oral     SpO2 10/13/20 1509 99 %     Weight 10/13/20 1513 240 lb (108.9 kg)     Height 10/13/20 1513 5\' 7"  (1.702 m)     Head Circumference --      Peak Flow --      Pain Score 10/13/20 1513 7   Constitutional: Alert and oriented.  Eyes: Conjunctivae are normal.  ENT      Head: Normocephalic and atraumatic.      Nose: No congestion/rhinnorhea.      Mouth/Throat: Mucous membranes are moist.      Neck: No stridor. Hematological/Lymphatic/Immunilogical: No cervical lymphadenopathy. Cardiovascular: Normal rate, regular rhythm.  No murmurs, rubs, or gallops.  Respiratory: Normal respiratory effort without tachypnea nor retractions. Breath sounds are clear and equal bilaterally. No wheezes/rales/rhonchi. Gastrointestinal: Soft and tender to palpation in the right adnexa. No rebound. No guarding.  Genitourinary: Deferred Musculoskeletal: Normal range of motion in all extremities. No lower extremity  edema. Neurologic:  Normal speech and language. No gross focal neurologic deficits are appreciated.  Skin:  Skin is warm, dry and intact. No rash noted. Psychiatric: Mood and affect are normal. Speech and behavior are normal. Patient exhibits appropriate insight and judgment.  ____________________________________________    LABS (pertinent positives/negatives)  Upreg negative CBC wbc 6.4, hgb 14.6, plt 312 Lipase 46 CMP wnl except glu 107 ____________________________________________   EKG  None  ____________________________________________    RADIOLOGY  CT abd/pel No acute abnormality  US pelvis No acute abnormality  ____________________________________________   PROCEDURES  Procedures  ____________________________________________   INITIAL IMPRESSION / ASSESSMENT AND PLAN / ED COURSE  Pertinent labs & imaging results that were available during my care of the patient were reviewed by me and considered in my medical decision making (see chart for details).   Patient presented to the emergency department today because of concerns for right lower quadrant abdominal pain.  Patient did recently have diagnosis of COVID.  On exam she had some tenderness in the right lower quadrant.  She was afebrile here.  No leukocytosis.  Did start work-up with ultrasound to evaluate for possible ovarian pathology.  Ultrasound was normal.  I then had a discussion with the patient.  At this point my suspicion for appendicitis was low given lack of fever or leukocytosis.  Did discuss leaving with strict return precautions versus obtaining CT scan.  At this time patient did want to go ahead and get the CT scan.  I did think this was reasonable. CT scan however also did not show obvious cause of patient's pain. At this point do wonder if patient is having spasms, vs possible MSK pain given it is worse with movement. Doubt PID given lack of discharge. UA did show some signs of infection however  >50 squamous epithelial cells. Will plan on sending urine culture.   ____________________________________________   FINAL CLINICAL IMPRESSION(S) / ED DIAGNOSES  Final diagnoses:  Right adnexal tenderness  Abdominal pain, unspecified abdominal location     Note: This dictation was prepared with Dragon dictation. Any transcriptional errors that result from this process are unintentional     Phineas Semen, MD 10/13/20 2037

## 2020-10-13 NOTE — Discharge Instructions (Signed)
Please seek medical attention for any high fevers, chest pain, shortness of breath, change in behavior, persistent vomiting, bloody stool or any other new or concerning symptoms.  

## 2020-10-13 NOTE — ED Triage Notes (Signed)
Pt to ED sent from [facility] for RLQ since Monday. Sent to rule out ovarian cyst or appendicitis per pt .  Denies N/V.  Pt appears non tender upon palpitation   Discussed pt with Dr Derrill Kay

## 2020-10-15 LAB — URINE CULTURE

## 2020-10-18 NOTE — Telephone Encounter (Signed)
Patient to drop of urine sample for culture as last one requires a recollection of specimen.

## 2020-11-01 ENCOUNTER — Encounter: Payer: Self-pay | Admitting: Obstetrics and Gynecology

## 2020-11-01 ENCOUNTER — Ambulatory Visit (INDEPENDENT_AMBULATORY_CARE_PROVIDER_SITE_OTHER): Payer: 59 | Admitting: Obstetrics and Gynecology

## 2020-11-01 ENCOUNTER — Other Ambulatory Visit: Payer: Self-pay

## 2020-11-01 ENCOUNTER — Other Ambulatory Visit (HOSPITAL_COMMUNITY)
Admission: RE | Admit: 2020-11-01 | Discharge: 2020-11-01 | Disposition: A | Discharge status: Home / Self Care | Payer: 59 | Source: Ambulatory Visit | Attending: Obstetrics and Gynecology | Admitting: Obstetrics and Gynecology

## 2020-11-01 VITALS — BP 137/96 | HR 96 | Ht 67.0 in | Wt 248.9 lb

## 2020-11-01 DIAGNOSIS — Z113 Encounter for screening for infections with a predominantly sexual mode of transmission: Secondary | ICD-10-CM | POA: Diagnosis present

## 2020-11-01 DIAGNOSIS — R399 Unspecified symptoms and signs involving the genitourinary system: Secondary | ICD-10-CM

## 2020-11-01 DIAGNOSIS — R69 Illness, unspecified: Secondary | ICD-10-CM | POA: Diagnosis not present

## 2020-11-01 LAB — POCT URINALYSIS DIPSTICK
Bilirubin, UA: NEGATIVE
Blood, UA: NEGATIVE
Glucose, UA: NEGATIVE
Ketones, UA: NEGATIVE
Leukocytes, UA: NEGATIVE
Nitrite, UA: NEGATIVE
Protein, UA: NEGATIVE
Spec Grav, UA: 1.01 (ref 1.010–1.025)
Urobilinogen, UA: 0.2 E.U./dL
pH, UA: 6 (ref 5.0–8.0)

## 2020-11-01 NOTE — Progress Notes (Signed)
Pt present for UTI check and STD screening. Pt c/o cyst on skin and was not sure what it was and would like to be checked for STD.

## 2020-11-03 ENCOUNTER — Encounter: Payer: Self-pay | Admitting: Obstetrics and Gynecology

## 2020-11-03 LAB — CERVICOVAGINAL ANCILLARY ONLY
Bacterial Vaginitis (gardnerella): NEGATIVE
Candida Glabrata: NEGATIVE
Candida Vaginitis: NEGATIVE
Chlamydia: NEGATIVE
Comment: NEGATIVE
Comment: NEGATIVE
Comment: NEGATIVE
Comment: NEGATIVE
Comment: NEGATIVE
Comment: NORMAL
Neisseria Gonorrhea: NEGATIVE
Trichomonas: NEGATIVE

## 2020-11-03 LAB — URINE CULTURE: Organism ID, Bacteria: NO GROWTH

## 2020-11-03 NOTE — Progress Notes (Signed)
Patient not seen by provider. Allowed to drop of urine specimen and self-collect vaginal swab. Will inform of results once they have returned.    Rubie Maid, MD [facility]

## 2020-11-03 NOTE — Patient Instructions (Signed)
Safe Sex Practicing safe sex means taking steps before and during sex to reduce your risk of:  Getting an STI (sexually transmitted infection).  Giving your partner an STI.  Unwanted or unplanned pregnancy. How to practice safe sex Ways you can practice safe sex  Limit your sexual partners to only one partner who is having sex with only you.  Avoid using alcohol and drugs before having sex. Alcohol and drugs can affect your judgment.  Before having sex with a new partner: ? Talk to your partner about past partners, past STIs, and drug use. ? Get screened for STIs and discuss the results with your partner. Ask your partner to get screened too.  Check your body regularly for sores, blisters, rashes, or unusual discharge. If you notice any of these problems, visit your health care provider.  Avoid sexual contact if you have symptoms of an infection or you are being treated for an STI.  While having sex, use a condom. Make sure to: ? Use a condom every time you have vaginal, oral, or anal sex. Both females and males should wear condoms during oral sex. ? Keep condoms in place from the beginning to the end of sexual activity. ? Use a latex condom, if possible. Latex condoms offer the best protection. ? Use only water-based lubricants with a condom. Using petroleum-based lubricants or oils will weaken the condom and increase the chance that it will break.   Ways your health care provider can help you practice safe sex  See your health care provider for regular screenings, exams, and tests for STIs.  Talk with your health care provider about what kind of birth control (contraception) is best for you.  Get vaccinated against hepatitis B and human papillomavirus (HPV).  If you are at risk of being infected with HIV (human immunodeficiency virus), talk with your health care provider about taking a prescription medicine to prevent HIV infection. You are at risk for HIV if you: ? Are a man  who has sex with other men. ? Are sexually active with more than one partner. ? Take drugs by injection. ? Have a sex partner who has HIV. ? Have unprotected sex. ? Have sex with someone who has sex with both men and women. ? Have had an STI.   Follow these instructions at home:  Take over-the-counter and prescription medicines only as told by your health care provider.  Keep all follow-up visits. This is important. Where to find more information  Centers for Disease Control and Prevention: http://www.wolf.info/  Planned Parenthood: www.plannedparenthood.org  Office on [employer]: [place] Summary  Practicing safe sex means taking steps before and during sex to reduce your risk getting an STI, giving your partner an STI, and having an unwanted or unplanned pregnancy.  Before having sex with a new partner, talk to your partner about past partners, past STIs, and drug use.  Use a condom every time you have vaginal, oral, or anal sex. Both females and males should wear condoms during oral sex.  Check your body regularly for sores, blisters, rashes, or unusual discharge. If you notice any of these problems, visit your health care provider.  See your health care provider for regular screenings, exams, and tests for STIs. This information is not intended to replace advice given to you by your health care provider. Make sure you discuss any questions you have with your health care provider. Document Revised: 02/29/2020 Document Reviewed: 02/29/2020 Elsevier Patient Education  [place].

## 2020-11-04 ENCOUNTER — Encounter: Payer: 59 | Admitting: Obstetrics and Gynecology

## 2020-11-18 ENCOUNTER — Encounter: Payer: Managed Care, Other (non HMO) | Admitting: Obstetrics and Gynecology

## 2020-11-18 NOTE — Patient Instructions (Incomplete)

## 2020-11-22 ENCOUNTER — Encounter: Payer: Self-pay | Admitting: Obstetrics and Gynecology

## 2020-11-24 ENCOUNTER — Other Ambulatory Visit: Payer: Self-pay

## 2020-11-24 ENCOUNTER — Encounter: Payer: Self-pay | Admitting: Obstetrics and Gynecology

## 2020-11-24 ENCOUNTER — Ambulatory Visit (INDEPENDENT_AMBULATORY_CARE_PROVIDER_SITE_OTHER): Payer: 59 | Admitting: Obstetrics and Gynecology

## 2020-11-24 VITALS — BP 121/83 | HR 103 | Ht 67.0 in | Wt 239.8 lb

## 2020-11-24 DIAGNOSIS — E669 Obesity, unspecified: Secondary | ICD-10-CM | POA: Diagnosis not present

## 2020-11-24 DIAGNOSIS — Z7689 Persons encountering health services in other specified circumstances: Secondary | ICD-10-CM

## 2020-11-24 DIAGNOSIS — Z8616 Personal history of COVID-19: Secondary | ICD-10-CM | POA: Diagnosis not present

## 2020-11-24 HISTORY — DX: Personal history of COVID-19: Z86.16

## 2020-11-24 MED ORDER — PHENTERMINE HCL 37.5 MG PO CAPS: 37.5000 mg | ORAL_CAPSULE | ORAL | 0 refills | Status: DC

## 2020-11-24 NOTE — Progress Notes (Signed)
    GYNECOLOGY PROGRESS NOTE  Subjective:    Patient ID: JALEE SAINE, female    DOB: 09-05-1991, 30 y.o.   MRN: [id number]  HPI  Patient is a 30 y.o. female who presents for 2 month weight management follow up. She initiated use of Phentermine 5 months ago.  Denies any undesirable side effects and reports compliance with medications.   Does report being diagnosed with COVID last month. Only had mild symptoms (headache, fatigue, and loss of smell).    Current interventions:  1. Diet - feels like she lost appetite with COVID, but then began eating more after she recovered and gained some weight, but has lost a few more pounds recently.  2. Activity - usually (treadmill, elliptical, some strength training) 2-3 times per week. Had slowed down with COVID but has picked back up, going to the gym.  3. Reports bowel movements are regular.    The following portions of the patient's history were reviewed and updated as appropriate: allergies, current medications, past family history, past medical history, past social history, past surgical history and problem list.  Review of Systems A comprehensive review of systems was negative except for: skin - reports rash on forearm that appeared after COVID-19 infection, thinks it is ringworm, has been using an OTC antifungal agent.    Objective:    Vitals with BMI 11/24/2020 11/01/2020 10/13/2020  Height 5\' 7"  5\' 7"  -  Weight 239 lbs 13 oz 248 lbs 14 oz -  BMI 37.34 28.76 -  Systolic 811 572 620  Diastolic 83 96 72  Pulse 355 96 64    General appearance: alert and no distress  Abdomen: soft, non-tender.  Waist circumference 40 in (previously 42 3/4 in).    Labs:  No new labs Assessment:   Weight management Obesity, Body mass index is 37.56 kg/m.  Plan:   Patient doing well on medications no major issues or side effects.  Total weight loss so far is 38 lbs. BMI has decreased from 43 to 37.  Goal is to get under 36 with medications. Can  continue medication as she doing well. Will allow 1 additional month. Continue current diet and exercise regimen.   Then will continue with weight maintenance with diet and exercise. To f/u in 2 months to continue to monitor patient's weight loss progress per request.    Rubie Maid, MD [facility]

## 2020-11-24 NOTE — Progress Notes (Signed)
Pt present for weight management. Pt stated that she was doing well and having no side effects from the phentermine. Last visit 09/08/2020 weight 247lb.

## 2021-01-24 ENCOUNTER — Encounter: Payer: 59 | Admitting: Obstetrics and Gynecology

## 2021-04-06 ENCOUNTER — Other Ambulatory Visit: Payer: Self-pay

## 2021-04-06 ENCOUNTER — Other Ambulatory Visit (HOSPITAL_COMMUNITY)
Admission: RE | Admit: 2021-04-06 | Discharge: 2021-04-06 | Disposition: A | Discharge status: Home / Self Care | Payer: 59 | Source: Ambulatory Visit | Attending: Obstetrics and Gynecology | Admitting: Obstetrics and Gynecology

## 2021-04-06 ENCOUNTER — Ambulatory Visit (INDEPENDENT_AMBULATORY_CARE_PROVIDER_SITE_OTHER): Payer: 59 | Admitting: Obstetrics and Gynecology

## 2021-04-06 ENCOUNTER — Encounter: Payer: Self-pay | Admitting: Obstetrics and Gynecology

## 2021-04-06 ENCOUNTER — Encounter: Payer: 59 | Admitting: Obstetrics and Gynecology

## 2021-04-06 VITALS — BP 121/62 | HR 89 | Ht 67.0 in | Wt 246.4 lb

## 2021-04-06 DIAGNOSIS — K9041 Non-celiac gluten sensitivity: Secondary | ICD-10-CM | POA: Diagnosis not present

## 2021-04-06 DIAGNOSIS — B373 Candidiasis of vulva and vagina: Secondary | ICD-10-CM | POA: Diagnosis not present

## 2021-04-06 DIAGNOSIS — Z7689 Persons encountering health services in other specified circumstances: Secondary | ICD-10-CM

## 2021-04-06 DIAGNOSIS — Z113 Encounter for screening for infections with a predominantly sexual mode of transmission: Secondary | ICD-10-CM | POA: Insufficient documentation

## 2021-04-06 DIAGNOSIS — R69 Illness, unspecified: Secondary | ICD-10-CM | POA: Diagnosis not present

## 2021-04-06 DIAGNOSIS — N760 Acute vaginitis: Secondary | ICD-10-CM

## 2021-04-06 DIAGNOSIS — N898 Other specified noninflammatory disorders of vagina: Secondary | ICD-10-CM | POA: Diagnosis present

## 2021-04-06 NOTE — Progress Notes (Signed)
    GYNECOLOGY PROGRESS NOTE  Subjective:    Patient ID: Melanie Frederick, female    DOB: 07-16-1991, 30 y.o.   MRN: [id number]    Patient is a 30 y.o. female who presents was scheduled for weight management follow up reports that she thought this would be her annual physical as her insurance requires an annual exam before July 1.  I discussed that I could not perform a full physical in the time slot allotted however I could attempt to address any acute problems that she had at this time..     Patient reports that she does want to get back to her weight loss plan.  She has been weight lifting, and has ordered Herbalife to start on next month.  She does note that she has been slacking off lately in the past several weeks to months as she has had multiple vacations. Patient reports vaginal itching for approximately 2 weeks with odor.  She denies any concerns for STDs. 3, patient notes that she would desire further work-up for possible celiac disease.  Notes significant bloating and stomach cramping after eating gluten products.     The following portions of the patient's history were reviewed and updated as appropriate: allergies, current medications, past family history, past medical history, past social history, past surgical history and problem list.  Review of Systems A comprehensive review of systems was negative except for: skin - reports rash on forearm that appeared after COVID-19 infection, thinks it is ringworm, has been using an OTC antifungal agent.    Objective:    Vitals with BMI 04/06/2021 11/24/2020 11/01/2020  Height 5\' 7"  5\' 7"  5\' 7"   Weight 246 lbs 6 oz 239 lbs 13 oz 248 lbs 14 oz  BMI 38.58 50.35 46.56  Systolic 812 751 700  Diastolic 62 83 96  Pulse 89 103 96    General appearance: alert and no distress  General appearance: alert and no distress Lungs: clear to auscultation bilaterally Heart: regular rate and rhythm, S1, S2 normal, no murmur, click, rub or  gallop Abdomen: soft, non-tender; bowel sounds normal; no masses,  no organomegaly Pelvic: external genitalia normal, rectovaginal septum normal.  Vagina moderate amount of homogeneous whtie discharge.  Cervix normal appearing, no lesions and no motion tenderness.  Uterus mobile, nontender, normal shape and size.  Adnexae non-palpable, nontender bilaterally.  Extremities: extremities normal, atraumatic, no cyanosis or edema   Labs:  No new labs Assessment:    Encounter for weight management Gluten intolerance  Acute vaginitis  STD screening  Plan:   Patient plans to resume weight loss management again starting with use of Herbalife and physical activity.  I discussed that if she reaches a plateau she can consider resumption of medication management to help with weight loss. Gluten intolerance -patient noting intolerance and desires to have testing.  Discussed option of referral to GI or PCP for screening for celiac disease, or can perform antibody testing today, and if positive, can refer at that time.  Patient okay to be tested today. Acute vaginitis -likely BV infection based on current symptoms and appearance of discharge today.  Can prescribe Flagyl. Cervicovaginal cultures performed. Cervical STD screening performed today at patient request as well. Form completed for patient's job noting patient was seen in office and underwent limited exam.    Rubie Maid, MD [facility]

## 2021-04-06 NOTE — Progress Notes (Signed)
   Pt present for weight check/management and vaginal itching. Pt's last visit 11/24/2020 weight was 239 lb and BMI 37.56 kg/m. Today's visit BMI 38.59 ;waist 43" and weight 246.4lb .

## 2021-04-09 ENCOUNTER — Encounter: Payer: Self-pay | Admitting: Obstetrics and Gynecology

## 2021-04-09 NOTE — Patient Instructions (Signed)
Preventive Care 21-28 Years Old, Female Preventive care refers to lifestyle choices and visits with your health care provider that can promote health and wellness. This includes: A yearly physical exam. This is also called an annual wellness visit. Regular dental and eye exams. Immunizations. Screening for certain conditions. Healthy lifestyle choices, such as: Eating a healthy diet. Getting regular exercise. Not using drugs or products that contain nicotine and tobacco. Limiting alcohol use. What can I expect for my preventive care visit? Physical exam Your health care provider may check your: Height and weight. These may be used to calculate your BMI (body mass index). BMI is a measurement that tells if you are at a healthy weight. Heart rate and blood pressure. Body temperature. Skin for abnormal spots. Counseling Your health care provider may ask you questions about your: Past medical problems. Family's medical history. Alcohol, tobacco, and drug use. Emotional well-being. Home life and relationship well-being. Sexual activity. Diet, exercise, and sleep habits. Work and work environment. Access to firearms. Method of birth control. Menstrual cycle. Pregnancy history. What immunizations do I need?  Vaccines are usually given at various ages, according to a schedule. Your health care provider will recommend vaccines for you based on your age, medicalhistory, and lifestyle or other factors, such as travel or where you work. What tests do I need?  Blood tests Lipid and cholesterol levels. These may be checked every 5 years starting at age 20. Hepatitis C test. Hepatitis B test. Screening Diabetes screening. This is done by checking your blood sugar (glucose) after you have not eaten for a while (fasting). STD (sexually transmitted disease) testing, if you are at risk. BRCA-related cancer screening. This may be done if you have a family history of breast, ovarian, tubal, or  peritoneal cancers. Pelvic exam and Pap test. This may be done every 3 years starting at age 21. Starting at age 30, this may be done every 5 years if you have a Pap test in combination with an HPV test. Talk with your health care provider about your test results, treatment options,and if necessary, the need for more tests. Follow these instructions at home: Eating and drinking  Eat a healthy diet that includes fresh fruits and vegetables, whole grains, lean protein, and low-fat dairy products. Take vitamin and mineral supplements as recommended by your health care provider. Do not drink alcohol if: Your health care provider tells you not to drink. You are pregnant, may be pregnant, or are planning to become pregnant. If you drink alcohol: Limit how much you have to 0-1 drink a day. Be aware of how much alcohol is in your drink. In the [place] one drink equals one 12 oz bottle of beer (355 mL), one 5 oz glass of wine (148 mL), or one 1 oz glass of hard liquor (44 mL).  Lifestyle Take daily care of your teeth and gums. Brush your teeth every morning and night with fluoride toothpaste. Floss one time each day. Stay active. Exercise for at least 30 minutes 5 or more days each week. Do not use any products that contain nicotine or tobacco, such as cigarettes, e-cigarettes, and chewing tobacco. If you need help quitting, ask your health care provider. Do not use drugs. If you are sexually active, practice safe sex. Use a condom or other form of protection to prevent STIs (sexually transmitted infections). If you do not wish to become pregnant, use a form of birth control. If you plan to become pregnant, see your health care   provider for a prepregnancy visit. Find healthy ways to cope with stress, such as: Meditation, yoga, or listening to music. Journaling. Talking to a trusted person. Spending time with friends and family. Safety Always wear your seat belt while driving or riding in a  vehicle. Do not drive: If you have been drinking alcohol. Do not ride with someone who has been drinking. When you are tired or distracted. While texting. Wear a helmet and other protective equipment during sports activities. If you have firearms in your house, make sure you follow all gun safety procedures. Seek help if you have been physically or sexually abused. What's next? Go to your health care provider once a year for an annual wellness visit. Ask your health care provider how often you should have your eyes and teeth checked. Stay up to date on all vaccines. This information is not intended to replace advice given to you by your health care provider. Make sure you discuss any questions you have with your healthcare provider. Document Revised: 05/22/2020 Document Reviewed: 06/05/2018 Elsevier Patient Education  2022 [employer].

## 2021-04-11 LAB — CERVICOVAGINAL ANCILLARY ONLY
Bacterial Vaginitis (gardnerella): NEGATIVE
Candida Glabrata: NEGATIVE
Candida Vaginitis: POSITIVE — AB
Chlamydia: NEGATIVE
Comment: NEGATIVE
Comment: NEGATIVE
Comment: NEGATIVE
Comment: NEGATIVE
Comment: NEGATIVE
Comment: NORMAL
Neisseria Gonorrhea: NEGATIVE
Trichomonas: NEGATIVE

## 2021-04-13 ENCOUNTER — Other Ambulatory Visit: Payer: Self-pay

## 2021-04-13 LAB — TISSUE TRANSGLUTAMINASE ABS,IGG,IGA
Tissue Transglut Ab: 3 U/mL (ref 0–5)
Transglutaminase IgA: <2 U/mL (ref 0–3)

## 2021-04-13 MED ORDER — FLUCONAZOLE 150 MG PO TABS: 150.0000 mg | ORAL_TABLET | Freq: Once | ORAL | 0 refills | Status: AC

## 2021-04-13 MED ORDER — METRONIDAZOLE 500 MG PO TABS: 500.0000 mg | ORAL_TABLET | Freq: Two times a day (BID) | ORAL | 0 refills | Status: DC

## 2021-04-15 IMAGING — US US PELVIS COMPLETE TRANSABD/TRANSVAG W DUPLEX
1 series · 13 of 25 positions shown · non-contrast
Comparison: None.

CLINICAL DATA: Right adnexal tenderness x4 days.

EXAM:
TRANSABDOMINAL AND TRANSVAGINAL ULTRASOUND OF PELVIS
DOPPLER ULTRASOUND OF OVARIES
TECHNIQUE: Both transabdominal and transvaginal ultrasound examinations of the
pelvis were performed. Transabdominal technique was performed for
global imaging of the pelvis including uterus, ovaries, adnexal
regions, and pelvic cul-de-sac.
It was necessary to proceed with endovaginal exam following the
transabdominal exam to visualize the uterus, endometrium, bilateral
ovaries and bilateral adnexa. Color and duplex Doppler ultrasound
was utilized to evaluate blood flow to the ovaries.

[Series 1: us pelvic complete w transvaginal and torsion righ · 13 of 216 slices shown]
[im 1/216]
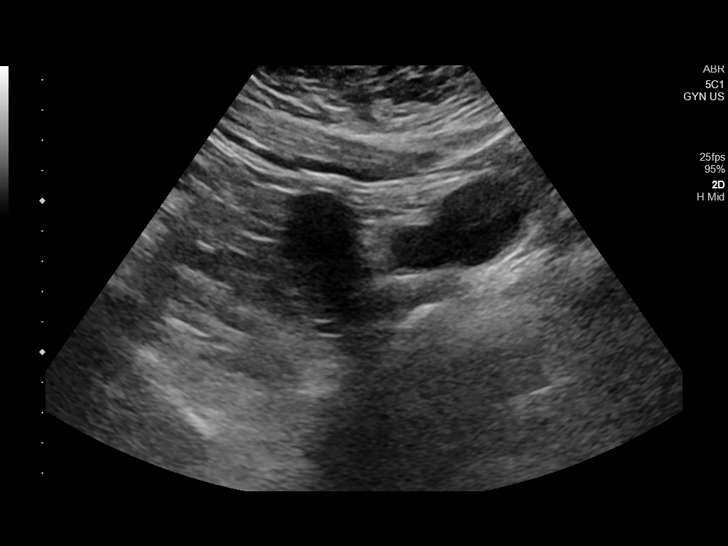
[im 18/216]
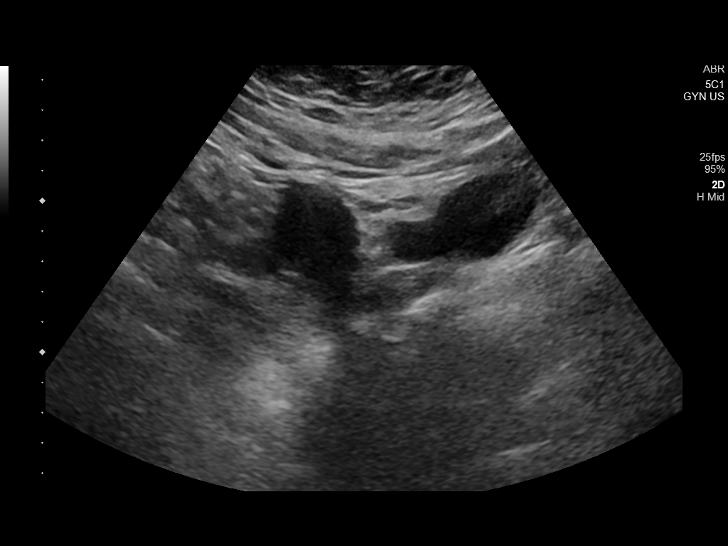
[im 36/216]
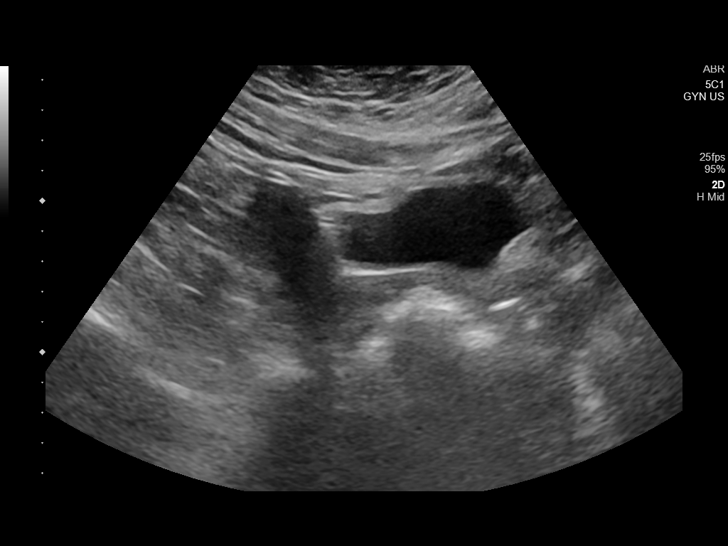
[im 54/216]
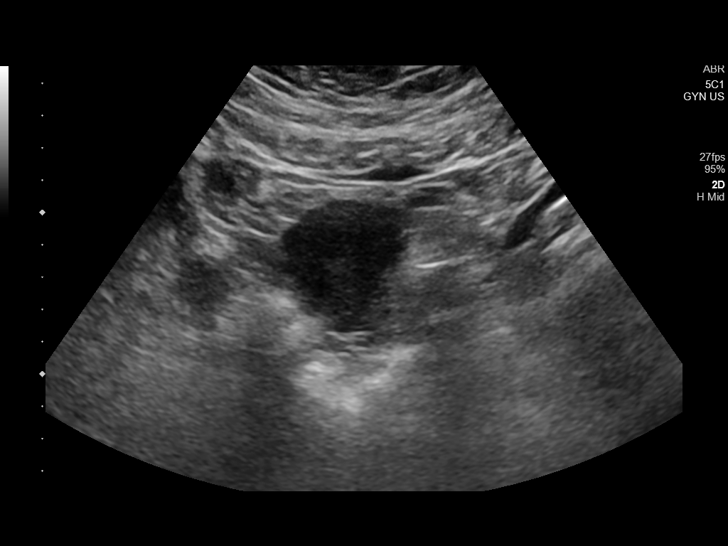
[im 72/216]
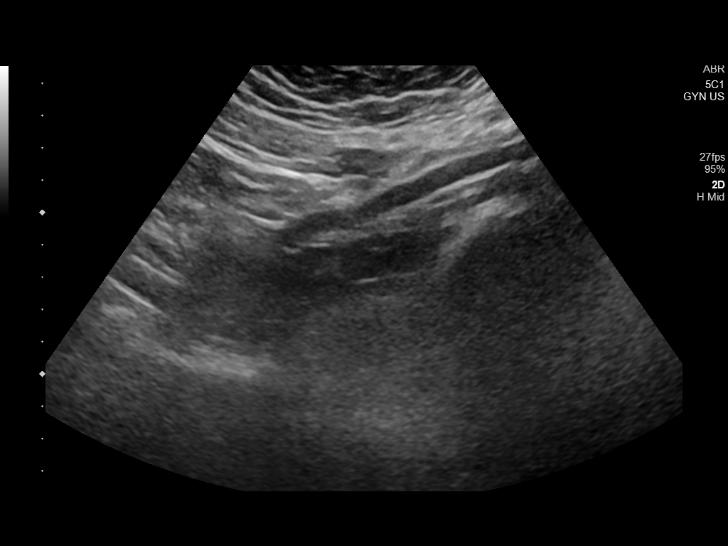
[im 90/216]
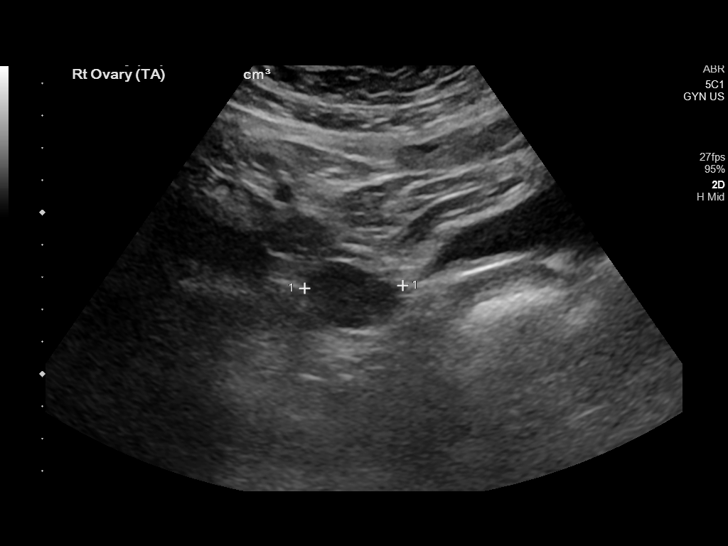
[im 108/216]
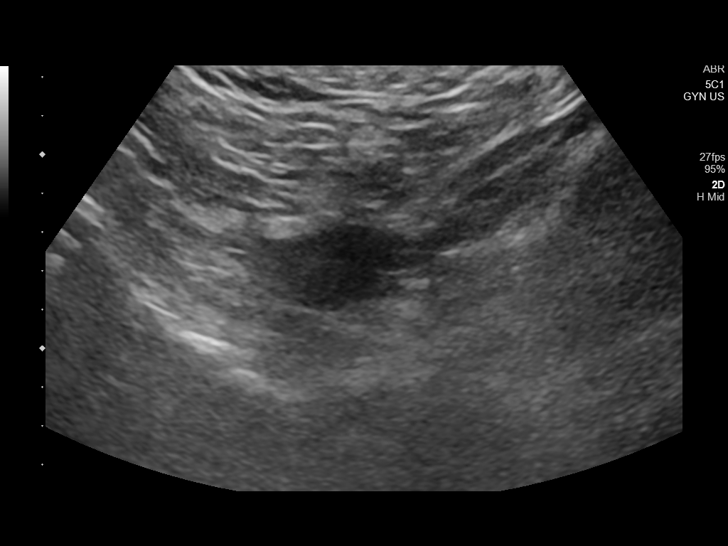
[im 126/216]
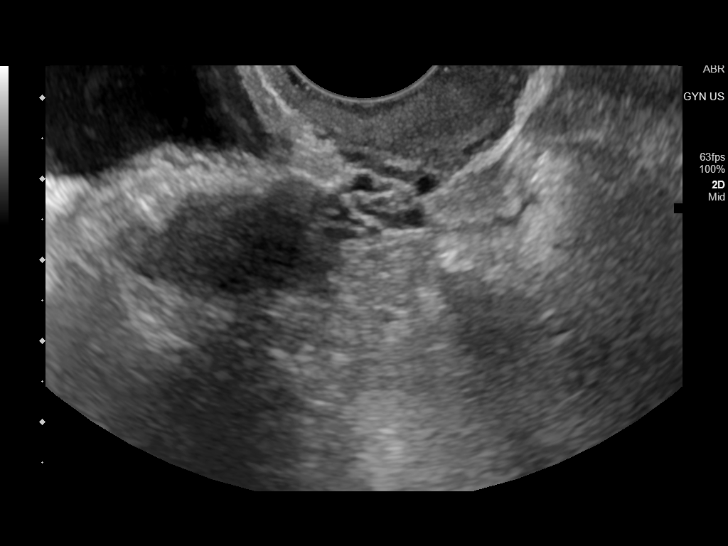
[im 144/216]
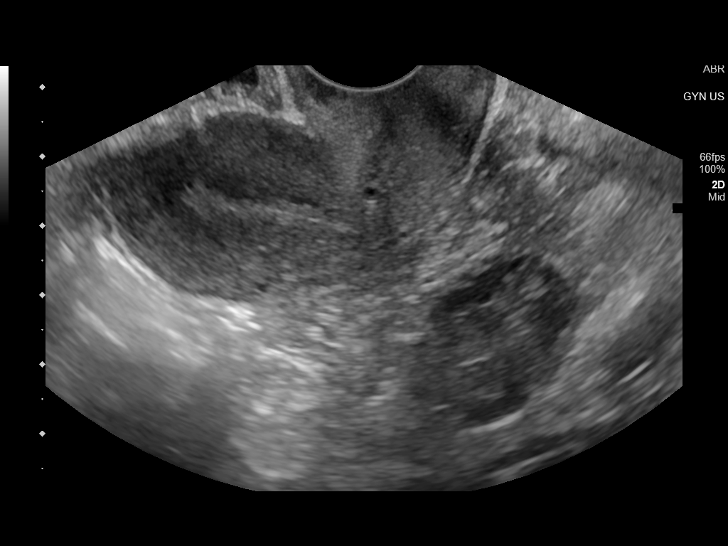
[im 162/216]
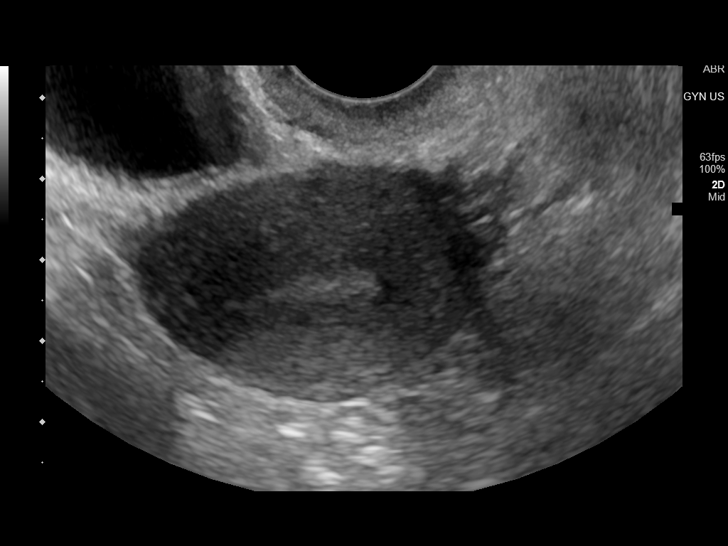
[im 180/216]
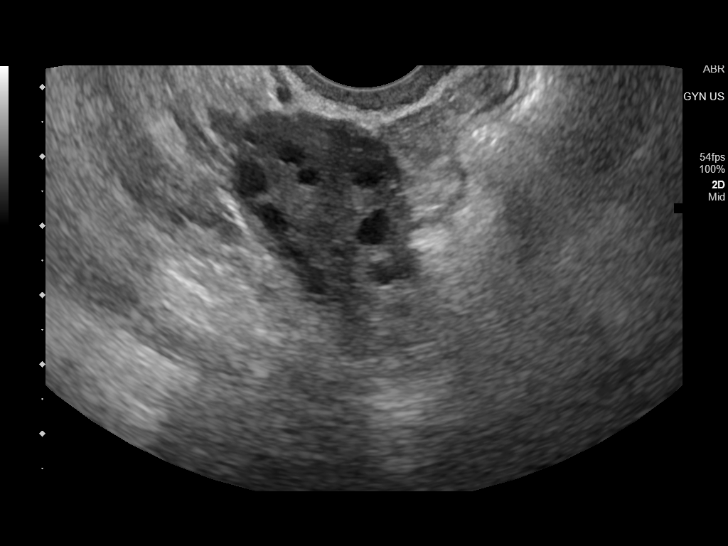
[im 198/216]
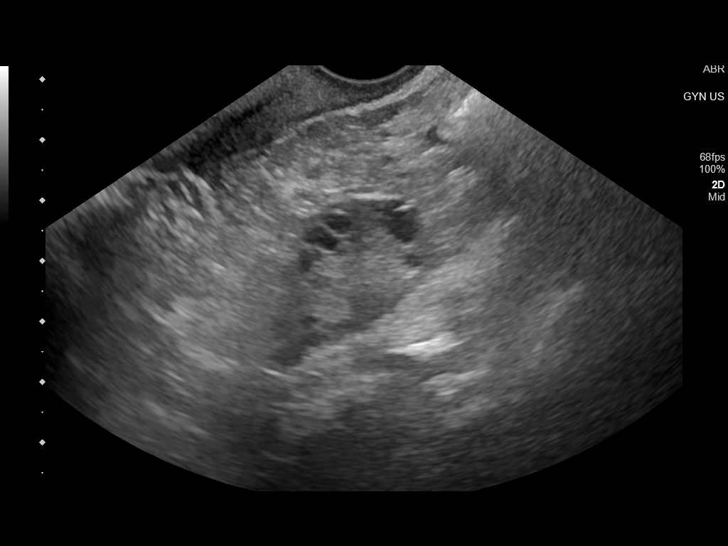
[im 216/216]
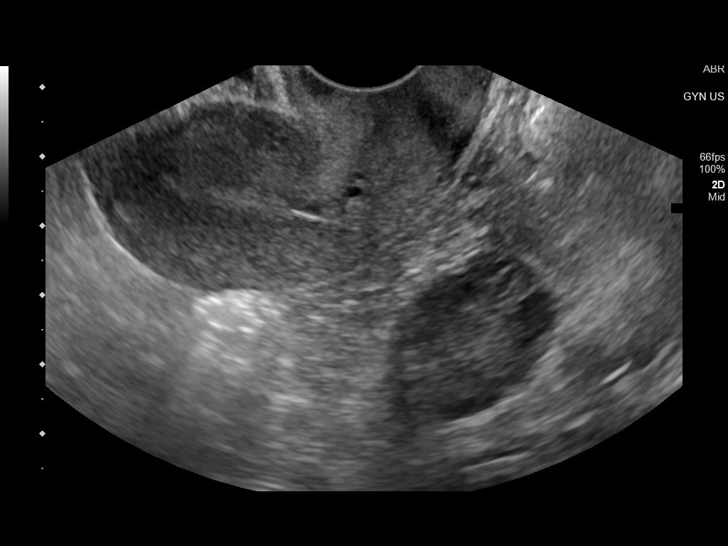

[13 of 25 positions shown; findings below may reference images not displayed]

FINDINGS: Uterus

Measurements: 6.1 cm x 3.0 cm x 4.6 cm = volume: 43.5 mL. No
fibroids or other mass visualized.

Endometrium

Thickness: 6.0 mm.  No focal abnormality visualized.

Right ovary

Measurements: 3.7 cm x 2.4 cm x 2.1 cm = volume: 9.9 mL. Normal
appearance/no adnexal mass.

Left ovary

Measurements: 3.4 cm x 2.1 cm x 3.1 cm = volume: 11.6 mL. Normal
appearance/no adnexal mass.

Pulsed Doppler evaluation of both ovaries demonstrates normal
low-resistance arterial and venous waveforms.

Other findings

No abnormal free fluid.
IMPRESSION: Normal pelvic ultrasound.

## 2021-05-31 ENCOUNTER — Ambulatory Visit: Payer: 59 | Admitting: Physician Assistant

## 2021-06-02 ENCOUNTER — Encounter: Payer: Self-pay | Admitting: Obstetrics and Gynecology

## 2021-06-09 ENCOUNTER — Encounter: Payer: Self-pay | Admitting: Obstetrics and Gynecology

## 2021-06-09 ENCOUNTER — Other Ambulatory Visit: Payer: Self-pay

## 2021-06-09 ENCOUNTER — Ambulatory Visit (INDEPENDENT_AMBULATORY_CARE_PROVIDER_SITE_OTHER): Payer: 59 | Admitting: Obstetrics and Gynecology

## 2021-06-09 VITALS — BP 115/82 | HR 90 | Ht 67.0 in | Wt 249.9 lb

## 2021-06-09 DIAGNOSIS — L739 Follicular disorder, unspecified: Secondary | ICD-10-CM | POA: Diagnosis not present

## 2021-06-09 DIAGNOSIS — F419 Anxiety disorder, unspecified: Secondary | ICD-10-CM

## 2021-06-09 DIAGNOSIS — R69 Illness, unspecified: Secondary | ICD-10-CM | POA: Diagnosis not present

## 2021-06-09 MED ORDER — SERTRALINE HCL 50 MG PO TABS: 50.0000 mg | ORAL_TABLET | Freq: Every day | ORAL | 1 refills | Status: DC

## 2021-06-09 NOTE — Patient Instructions (Signed)
Generalized Anxiety Disorder, Adult Generalized anxiety disorder (GAD) is a mental health condition. Unlike normal worries, anxiety related to GAD is not triggered by a specific event. These worries do not fade or get better with time. GAD interferes with relationships, work, and school. GAD symptoms can vary from mild to severe. People with severe GAD can have intense waves of anxiety with physical symptoms that are similar to panic attacks. What are the causes? The exact cause of GAD is not known, but the following are believed to have an impact: Differences in natural brain chemicals. Genes passed down from parents to children. Differences in the way threats are perceived. Development during childhood. Personality. What increases the risk? The following factors may make you more likely to develop this condition: Being female. Having a family history of anxiety disorders. Being very shy. Experiencing very stressful life events, such as the death of a loved one. Having a very stressful family environment. What are the signs or symptoms? People with GAD often worry excessively about many things in their lives, such as their health and family. Symptoms may also include: Mental and emotional symptoms: Worrying excessively about natural disasters. Fear of being late. Difficulty concentrating. Fears that others are judging your performance. Physical symptoms: Fatigue. Headaches, muscle tension, muscle twitches, trembling, or feeling shaky. Feeling like your heart is pounding or beating very fast. Feeling out of breath or like you cannot take a deep breath. Having trouble falling asleep or staying asleep, or experiencing restlessness. Sweating. Nausea, diarrhea, or irritable bowel syndrome (IBS). Behavioral symptoms: Experiencing erratic moods or irritability. Avoidance of new situations. Avoidance of people. Extreme difficulty making decisions. How is this diagnosed? This condition  is diagnosed based on your symptoms and medical history. You will also have a physical exam. Your health care provider may perform tests to rule out other possible causes of your symptoms. To be diagnosed with GAD, a person must have anxiety that: Is out of his or her control. Affects several different aspects of his or her life, such as work and relationships. Causes distress that makes him or her unable to take part in normal activities. Includes at least three symptoms of GAD, such as restlessness, fatigue, trouble concentrating, irritability, muscle tension, or sleep problems. Before your health care provider can confirm a diagnosis of GAD, these symptoms must be present more days than they are not, and they must last for 6 months or longer. How is this treated? This condition may be treated with: Medicine. Antidepressant medicine is usually prescribed for long-term daily control. Anti-anxiety medicines may be added in severe cases, especially when panic attacks occur. Talk therapy (psychotherapy). Certain types of talk therapy can be helpful in treating GAD by providing support, education, and guidance. Options include: Cognitive behavioral therapy (CBT). People learn coping skills and self-calming techniques to ease their physical symptoms. They learn to identify unrealistic thoughts and behaviors and to replace them with more appropriate thoughts and behaviors. Acceptance and commitment therapy (ACT). This treatment teaches people how to be mindful as a way to cope with unwanted thoughts and feelings. Biofeedback. This process trains you to manage your body's response (physiological response) through breathing techniques and relaxation methods. You will work with a therapist while machines are used to monitor your physical symptoms. Stress management techniques. These include yoga, meditation, and exercise. A mental health specialist can help determine which treatment is best for you. Some  people see improvement with one type of therapy. However, other people require a combination   of therapies. Follow these instructions at home: Lifestyle Maintain a consistent routine and schedule. Anticipate stressful situations. Create a plan, and allow extra time to work with your plan. Practice stress management or self-calming techniques that you have learned from your therapist or your health care provider. General instructions Take over-the-counter and prescription medicines only as told by your health care provider. Understand that you are likely to have setbacks. Accept this and be kind to yourself as you persist to take better care of yourself. Recognize and accept your accomplishments, even if you judge them as small. Keep all follow-up visits as told by your health care provider. This is important. Contact a health care provider if: Your symptoms do not get better. Your symptoms get worse. You have signs of depression, such as: A persistently sad or irritable mood. Loss of enjoyment in activities that used to bring you joy. Change in weight or eating. Changes in sleeping habits. Avoiding friends or family members. Loss of energy for normal tasks. Feelings of guilt or worthlessness. Get help right away if: You have serious thoughts about hurting yourself or others. If you ever feel like you may hurt yourself or others, or have thoughts about taking your own life, get help right away. Go to your nearest emergency department or: Call your local emergency services (911 in the [place] Call a suicide crisis helpline, such as the National Suicide Prevention Lifeline at 1-800-273-8255. This is open 24 hours a day in the [place] Text the Crisis Text Line at 741741 (in the [place] Summary Generalized anxiety disorder (GAD) is a mental health condition that involves worry that is not triggered by a specific event. People with GAD often worry excessively about many things in their lives, such  as their health and family. GAD may cause symptoms such as restlessness, trouble concentrating, sleep problems, frequent sweating, nausea, diarrhea, headaches, and trembling or muscle twitching. A mental health specialist can help determine which treatment is best for you. Some people see improvement with one type of therapy. However, other people require a combination of therapies. This information is not intended to replace advice given to you by your health care provider. Make sure you discuss any questions you have with your health care provider. Document Revised: 07/15/2019 Document Reviewed: 07/15/2019 Elsevier Patient Education  2022 Elsevier Inc[employer]  

## 2021-06-09 NOTE — Progress Notes (Signed)
GYNECOLOGY PROGRESS NOTE  Subjective:    Patient ID: Melanie Frederick, female    DOB: 04-Aug-1991, 30 y.o.   MRN: [id number]  HPI  Patient is a 30 y.o. G0P0000 female who presents for several issues today:  and to discuss anxiety issues.   Cyst on pelvic area - Noted several weeks ago while shaving. States it was approximately the size of a walnut, was in the left groin region. Noted some bleeding from it, was tender to touch. Denied fevers or chills. Has since gone down.  Anxiety - notes stress on her job.  Works night shift.  Notes generalized anxiety (no particular worry or concern).  Also currently living with her parents which adds stress. Sometimes feels chest tightness.  May also have a family history of anxiety (mother, and uncle).  Reports no previous diagnosed h/o anxiety except 1 panic attack in high school.  Has recently been seen by a work Social worker at her job, but did not feel like it was very helpful.  Thinks she may be getting psoriasis. Has notice dry patches of skin along her forehead, scalp, and cheek region.   The following portions of the patient's history were reviewed and updated as appropriate: allergies, current medications, past family history, past medical history, past social history, past surgical history, and problem list.  Review of Systems Pertinent items noted in HPI and remainder of comprehensive ROS otherwise negative.   Objective:   Blood pressure 115/82, pulse 90, height '5\' 7"'$  (1.702 m), weight 249 lb 14.4 oz (113.4 kg), last menstrual period 05/13/2021.  Body mass index is 39.14 kg/m. General appearance: alert, cooperative, appears stated age, and no distress Abdomen: soft, non-tender.  Pelvis: external genitalia normal, miniscule lesion on left vulva majora near groin region that appears to be an area of healing folliculitis. No groin lesions or masses appreciated.  Psych: normal mood and normal speech and though content.    GAD 7 : Generalized  Anxiety Score 06/09/2021  Nervous, Anxious, on Edge 2  Control/stop worrying 3  Worry too much - different things 3  Trouble relaxing 2  Restless 2  Easily annoyed or irritable 2  Afraid - awful might happen 2  Total GAD 7 Score 16  Anxiety Difficulty Very difficult     Depression screen PHQ 2/9 06/09/2021  Decreased Interest 0  Down, Depressed, Hopeless 0  PHQ - 2 Score 0  Altered sleeping 3  Tired, decreased energy 3  Change in appetite 2  Feeling bad or failure about yourself  0  Trouble concentrating 2  Moving slowly or fidgety/restless 2  Suicidal thoughts 0  PHQ-9 Score 12  Difficult doing work/chores Somewhat difficult     Assessment:   1. Folliculitis   2. Anxiety      Plan:   Discussed that cystic area was likely folliculitis, has mostly resolved. Advised on skin care regimen and help with reduction of ingrown hairs, also can consider waxing.   Anxiety - discussed stress relievers, also advised on options for counseling and use of medications for management of anxiety.  Patient thinks she would like to try medication. Will initiate Zoloft.  Will f/u in 1 month to reassess symptoms.  Also discussed length of use, can consider weaning after 4-6 months, or if work/home situation changes and she feels medications may no longer be needed.    A total of 15 minutes were spent face-to-face with the patient during this encounter and over half of that time dealt  with counseling and coordination of care.    Rubie Maid, MD [facility]

## 2021-07-12 ENCOUNTER — Encounter: Payer: Self-pay | Admitting: Obstetrics and Gynecology

## 2021-07-12 ENCOUNTER — Other Ambulatory Visit: Payer: Self-pay

## 2021-07-12 ENCOUNTER — Ambulatory Visit (INDEPENDENT_AMBULATORY_CARE_PROVIDER_SITE_OTHER): Payer: 59 | Admitting: Obstetrics and Gynecology

## 2021-07-12 VITALS — BP 111/71 | HR 80 | Ht 67.0 in | Wt 253.7 lb

## 2021-07-12 DIAGNOSIS — F419 Anxiety disorder, unspecified: Secondary | ICD-10-CM | POA: Diagnosis not present

## 2021-07-12 DIAGNOSIS — R69 Illness, unspecified: Secondary | ICD-10-CM | POA: Diagnosis not present

## 2021-07-12 NOTE — Progress Notes (Signed)
    GYNECOLOGY PROGRESS NOTE  Subjective:    Patient ID: APRILL BANKO, female    DOB: 1991-09-18, 30 y.o.   MRN: [id number]  HPI  Patient is a 30 y.o. G0P0000 female who presents for 1 month follow up for anxiety. Patient stated that she has noticed an improvement in her symptoms since starting the medication Zoloft). Denies any undesirable side effects and notes compliance with medication.     The following portions of the patient's history were reviewed and updated as appropriate: allergies, current medications, past family history, past medical history, past social history, past surgical history, and problem list.  Review of Systems Pertinent items noted in HPI and remainder of comprehensive ROS otherwise negative.   Objective:   Blood pressure 111/71, pulse 80, height 5\' 7"  (1.702 m), weight 253 lb 11.2 oz (115.1 kg), last menstrual period 07/11/2021. Body mass index is 39.74 kg/m. General appearance: alert, cooperative, appears stated age, and no distress Neurologic: Grossly normal Psych: normal mood, normal affect   Depression screen [facility] 2/9 07/12/2021 06/09/2021  Decreased Interest 0 0  Down, Depressed, Hopeless 0 0  PHQ - 2 Score 0 0  Altered sleeping 0 3  Tired, decreased energy 0 3  Change in appetite 1 2  Feeling bad or failure about yourself  0 0  Trouble concentrating 0 2  Moving slowly or fidgety/restless 0 2  Suicidal thoughts 0 0  PHQ-9 Score 1 12  Difficult doing work/chores Not difficult at all Somewhat difficult     Assessment:   1. Anxiety      Plan:   1. Anxiety  - Doing well on Zoloft. Can continue medication. Will f/u in 3 months. Advised to continue medication for at least 3-6 months before considering any attempts to wean.      Rubie Maid, MD [facility]

## 2021-07-20 ENCOUNTER — Encounter: Payer: 59 | Admitting: Obstetrics and Gynecology

## 2021-08-17 ENCOUNTER — Encounter: Payer: Self-pay | Admitting: Physician Assistant

## 2021-08-17 ENCOUNTER — Other Ambulatory Visit: Payer: Self-pay | Admitting: Physician Assistant

## 2021-08-17 ENCOUNTER — Other Ambulatory Visit: Payer: Self-pay

## 2021-08-17 ENCOUNTER — Ambulatory Visit: Payer: 59 | Admitting: Physician Assistant

## 2021-08-17 DIAGNOSIS — R6889 Other general symptoms and signs: Secondary | ICD-10-CM

## 2021-08-17 DIAGNOSIS — Z1152 Encounter for screening for COVID-19: Secondary | ICD-10-CM

## 2021-08-17 DIAGNOSIS — H60391 Other infective otitis externa, right ear: Secondary | ICD-10-CM

## 2021-08-17 DIAGNOSIS — H60311 Diffuse otitis externa, right ear: Secondary | ICD-10-CM

## 2021-08-17 LAB — POCT INFLUENZA A/B
Influenza A, POC: NEGATIVE
Influenza B, POC: NEGATIVE

## 2021-08-17 MED ORDER — CIPRO HC 0.2-1 % OT SUSP: 3.0000 [drp] | Freq: Two times a day (BID) | OTIC | 0 refills | Status: DC

## 2021-08-17 NOTE — Progress Notes (Signed)
   Subjective: Right ear drainage    Patient ID: Melanie Frederick, female    DOB: [birth year], 30 y.o.   MRN: [id number]  HPI  Patient complaining of right ear pain and drainage for 2 days.  Patient also complains URI signs and symptoms.  Patient tested negative for influenza and COVID-19.  Review of Systems Depression    Objective:   Physical Exam  This is a virtual visit      Assessment & Plan: Otitis external   Patient given a prescription for Cipro HC to use as directed.  Follow-up in 4 days if no improvement or worsening complaint.

## 2021-08-17 NOTE — Progress Notes (Signed)
   Subjective: Right ear drainage and pain    Patient ID: Melanie Frederick, female    DOB: 11/28/1990, 30 y.o.   MRN: [id number]  HPI Patient presents with right ear pain and drainage.  Patient tested negative for flu and COVID-19.   Review of Systems Depression    Objective:   Physical Exam  This is a virtual visit      Assessment & Plan: Otitis externa

## 2021-08-17 NOTE — Progress Notes (Signed)
Symptoms started 08/14/21 : Congestion ,eft ear pain, sneezing, coughing up yellow greenish mucous that's making her throat irritated.  Tests performed: Rapid COVID& Flu   Covid: Negative FLU: A &B Negative  COVID PCR sent for confirmation.

## 2021-08-18 ENCOUNTER — Telehealth: Payer: Self-pay

## 2021-08-18 LAB — NOVEL CORONAVIRUS, NAA: SARS-CoV-2, NAA: NOT DETECTED

## 2021-08-18 LAB — SARS-COV-2, NAA 2 DAY TAT

## 2021-08-18 NOTE — Telephone Encounter (Signed)
Pt answered call, let her know PCR is negative and she exspressed understanding. Pt mentioned as well she will not be able to start ear drops until Monday due to pharmacy not having it available until then.

## 2021-08-24 DIAGNOSIS — S66911A Strain of unspecified muscle, fascia and tendon at wrist and hand level, right hand, initial encounter: Secondary | ICD-10-CM | POA: Diagnosis not present

## 2021-08-24 DIAGNOSIS — M67839 Other specified disorders of synovium and tendon, unspecified forearm: Secondary | ICD-10-CM | POA: Diagnosis not present

## 2021-08-29 ENCOUNTER — Encounter: Payer: Self-pay | Admitting: Obstetrics and Gynecology

## 2021-08-29 ENCOUNTER — Other Ambulatory Visit: Payer: Self-pay | Admitting: Physician Assistant

## 2021-08-29 MED ORDER — PSEUDOEPH-BROMPHEN-DM 30-2-10 MG/5ML PO SYRP: 5.0000 mL | ORAL_SOLUTION | Freq: Four times a day (QID) | ORAL | 0 refills | Status: DC | PRN

## 2021-08-29 MED ORDER — LIDOCAINE VISCOUS HCL 2 % MT SOLN: 5.0000 mL | Freq: Four times a day (QID) | OROMUCOSAL | 0 refills | Status: DC | PRN

## 2021-09-04 ENCOUNTER — Ambulatory Visit: Payer: Self-pay | Admitting: Physician Assistant

## 2021-09-07 ENCOUNTER — Ambulatory Visit: Payer: Self-pay | Admitting: Physician Assistant

## 2021-09-08 MED ORDER — HYDROCORTISONE 2.5 % EX CREA: TOPICAL_CREAM | Freq: Two times a day (BID) | CUTANEOUS | 0 refills | Status: DC

## 2021-10-17 ENCOUNTER — Encounter: Payer: Self-pay | Admitting: Physician Assistant

## 2021-10-17 ENCOUNTER — Ambulatory Visit: Payer: Self-pay | Admitting: Physician Assistant

## 2021-10-17 ENCOUNTER — Other Ambulatory Visit: Payer: Self-pay

## 2021-10-17 DIAGNOSIS — J020 Streptococcal pharyngitis: Secondary | ICD-10-CM

## 2021-10-17 DIAGNOSIS — J029 Acute pharyngitis, unspecified: Secondary | ICD-10-CM

## 2021-10-17 LAB — POCT RAPID STREP A (OFFICE): Rapid Strep A Screen: POSITIVE — AB

## 2021-10-17 MED ORDER — AMOXICILLIN 875 MG PO TABS: 875.0000 mg | ORAL_TABLET | Freq: Two times a day (BID) | ORAL | 0 refills | Status: AC

## 2021-10-17 MED ORDER — PSEUDOEPH-BROMPHEN-DM 30-2-10 MG/5ML PO SYRP
5.0000 mL | ORAL_SOLUTION | Freq: Four times a day (QID) | ORAL | 0 refills | Status: DC | PRN
Start: 2021-10-17 — End: 2021-12-06

## 2021-10-17 MED ORDER — LIDOCAINE VISCOUS HCL 2 % MT SOLN: 5.0000 mL | Freq: Four times a day (QID) | OROMUCOSAL | 0 refills | Status: DC | PRN

## 2021-10-17 NOTE — Progress Notes (Signed)
° °  Subjective: Sore throat    Patient ID: Melanie Frederick, female    DOB: Feb 10, 1991, 31 y.o.   MRN: [id number]  HPI Patient awake and this morning with sore throat and white spots on her tonsils.  State pain with swallowing but can tolerate soft food and fluids.  Denies fever/chills.  Tested negative for COVID-19 and influenza.  Positive for strep pharyngitis.   Review of Systems Negative septa chief complaint    Objective:   Physical Exam This is a virtual visit       Assessment & Plan: Pharyngitis   Patient get a prescription for amoxicillin, viscous lidocaine, Bromfed-DM, and ibuprofen.

## 2021-10-17 NOTE — Progress Notes (Signed)
Worked last night & throat started hurting early this morning - hasn't been to bed yet. States has "white spots" on right side of throat & tonsils swollen on both sides.  States right side of throat hurts the worst.  No known exposure to anyone with strep throat.  Rapid strep throat test = Positive.  AMD

## 2021-10-17 NOTE — Progress Notes (Signed)
Worked last night & throat started hurting early this morning - hasn't been to bed yet. States has "white spots" on right side of throat & tonsils swollen on both sides.  States right side of throat hurts the worst.   No known exposure to anyone with strep throat.   Rapid strep throat test = Positive.

## 2021-10-20 ENCOUNTER — Encounter: Payer: 59 | Admitting: Obstetrics and Gynecology

## 2021-11-01 ENCOUNTER — Encounter: Payer: Self-pay | Admitting: Obstetrics and Gynecology

## 2021-11-20 ENCOUNTER — Encounter: Payer: Self-pay | Admitting: Physician Assistant

## 2021-11-20 ENCOUNTER — Ambulatory Visit: Payer: Self-pay | Admitting: Physician Assistant

## 2021-11-20 ENCOUNTER — Other Ambulatory Visit: Payer: Self-pay

## 2021-11-20 VITALS — BP 116/77 | HR 72 | Temp 97.8°F | Resp 12 | Ht 67.0 in | Wt 260.0 lb

## 2021-11-20 DIAGNOSIS — R0683 Snoring: Secondary | ICD-10-CM

## 2021-11-20 NOTE — Progress Notes (Signed)
Nees new Rx for Albuterol Inhaler - expired - not using presently, usually needs it in the Spring.  Wakes up with uvula swollen in the mornings from snoring. Thinks she has sleep apnea.  States her father has sleep apnea.  Interested in a sleep study.  AMD

## 2021-11-20 NOTE — Progress Notes (Signed)
[facility]     ____________________________________________   None    (approximate)  I have reviewed the triage vital signs and the nursing notes.   HISTORY  Chief Complaint Uvula (Swollen) and Sleep Study    HPI Melanie Frederick is a 31 y.o. female patient complain of 3 months of intermitting swollen uvula.  Patient states long history of snoring which has increased in the past 3 months.  States waking up middle night gasping for air.  Past medical history remarkable for anxiety, seasonal rhinitis and weight gain.      Past Medical History:  Diagnosis Date   Anxiety    Fatigue    History of COVID-19 11/24/2020   Diagnosed January 2022   Nevus    Vaccine for human papilloma virus (HPV) types 6, 11, 16, and 18 administered     Patient Active Problem List   Diagnosis Date Noted   History of COVID-19 11/24/2020   Obesity (BMI 35.0-39.9 without comorbidity) 11/24/2020   Overweight 05/17/2020    Past Surgical History:  Procedure Laterality Date   ARM SKIN LESION BIOPSY / EXCISION Left 2012   LYMPH NODE DISSECTION Right 2011   mold removal Left    moval removal off of back    Prior to Admission medications   Medication Sig Start Date End Date Taking? Authorizing Provider  sertraline (ZOLOFT) 50 MG tablet Take 1 tablet (50 mg total) by mouth daily. Begin with 1/2 tablet x 2 weeks, then increase to 1 whole tablet. 06/09/21  Yes Rubie Maid, MD  albuterol (VENTOLIN HFA) 108 (90 Base) MCG/ACT inhaler As needed Patient not taking: Reported on 10/17/2021 01/14/20   [provider]  brompheniramine-pseudoephedrine-DM 30-2-10 MG/5ML syrup Take 5 mLs by mouth 4 (four) times daily as needed. Mix with 5 mL of viscous lidocaine for swish and swallow Patient not taking: Reported on 11/20/2021 10/17/21   Sable Feil, PA-C  lidocaine (XYLOCAINE) 2 % solution Use as directed 5 mLs in the mouth or throat every 6 (six) hours as needed for mouth pain. Mix  with 5 mL of Bromfed-DM for swish and swallow Patient not taking: Reported on 11/20/2021 10/17/21   Sable Feil, PA-C    Allergies Mushroom extract complex and Prednisone  Family History  Problem Relation Age of Onset   Hypothyroidism Paternal Grandmother    Brain cancer Other        malignant, great mat uncle-60   Cancer Other 82       colon cancer   Kidney disease Mother    Anemia Mother     Social History Social History   Tobacco Use   Smoking status: Never   Smokeless tobacco: Never  Vaping Use   Vaping Use: Never used  Substance Use Topics   Alcohol use: Yes    Comment: occasinally    Drug use: No    Review of Systems Constitutional: No fever/chills Eyes: No visual changes. ENT: No sore throat.  Uvular edema. Cardiovascular: Denies chest pain. Respiratory: Denies shortness of breath.  Nocturnal dyspnea.   Gastrointestinal: No abdominal pain.  No nausea, no vomiting.  No diarrhea.  No constipation. Genitourinary: Negative for dysuria. Musculoskeletal: Negative for back pain. Skin: Negative for rash. Neurological: Negative for headaches, focal weakness or numbness. Psychiatric: Anxiety Allergic/Immunilogical: Mushrooms and prednisone  ____________________________________________   PHYSICAL EXAM:  VITAL SIGNS: Temperature 97.8, pulse 72, respiration 12, BP is 116/77, patient 90% O2 sat on room air.  Patient weighs 260 pounds  and BMI is 40.72. Constitutional: Alert and oriented. Well appearing and in no acute distress. Eyes: Conjunctivae are normal. PERRL. EOMI. Head: Atraumatic. Nose: No congestion/rhinnorhea. Mouth/Throat: Mucous membranes are moist.  Oropharynx non-erythematous. Neck: No stridor.  No cervical spine tenderness to palpation. Hematological/Lymphatic/Immunilogical: No cervical lymphadenopathy. Cardiovascular: Normal rate, regular rhythm. Grossly normal heart sounds.  Good peripheral circulation. Respiratory: Normal respiratory effort.  No  retractions. Lungs CTAB. Gastrointestinal: Soft and nontender. No distention. No abdominal bruits. No CVA tenderness. Genitourinary: Deferred Musculoskeletal: No lower extremity tenderness nor edema.  No joint effusions. Neurologic:  Normal speech and language. No gross focal neurologic deficits are appreciated. No gait instability. Skin:  Skin is warm, dry and intact. No rash noted. Psychiatric: Mood and affect are normal. Speech and behavior are normal.  ____________________________________________   ____________________________________________   ____________________________________________    ____________________________________________   INITIAL IMPRESSION / ASSESSMENT   As part of my medical decision making, I reviewed the following data within the [facility]   Patient presents with intermittent over edema and increased snoring.  Patient complaint physical exam is consistent with sleep apnea.  Further evaluation with sleep study is warranted.             ____________________________________________   FINAL CLINICAL IMPRESSION(S) Snoring ED Discharge Orders     None        Note:  This document was prepared using Dragon voice recognition software and may include unintentional dictation errors.

## 2021-11-20 NOTE — Addendum Note (Signed)
Addended by: Aliene Altes on: 11/20/2021 04:47 PM   Modules accepted: Orders

## 2021-12-05 ENCOUNTER — Other Ambulatory Visit: Payer: Self-pay | Admitting: Obstetrics and Gynecology

## 2021-12-06 ENCOUNTER — Encounter: Payer: Self-pay | Admitting: Obstetrics and Gynecology

## 2021-12-06 ENCOUNTER — Other Ambulatory Visit: Payer: Self-pay

## 2021-12-06 ENCOUNTER — Other Ambulatory Visit (HOSPITAL_COMMUNITY)
Admission: RE | Admit: 2021-12-06 | Discharge: 2021-12-06 | Disposition: A | Discharge status: Home / Self Care | Payer: 59 | Source: Ambulatory Visit | Attending: Obstetrics and Gynecology | Admitting: Obstetrics and Gynecology

## 2021-12-06 ENCOUNTER — Ambulatory Visit (INDEPENDENT_AMBULATORY_CARE_PROVIDER_SITE_OTHER): Payer: 59 | Admitting: Obstetrics and Gynecology

## 2021-12-06 VITALS — BP 117/83 | HR 69 | Resp 16 | Ht 67.0 in | Wt 264.6 lb

## 2021-12-06 DIAGNOSIS — Z124 Encounter for screening for malignant neoplasm of cervix: Secondary | ICD-10-CM | POA: Insufficient documentation

## 2021-12-06 DIAGNOSIS — Z01419 Encounter for gynecological examination (general) (routine) without abnormal findings: Secondary | ICD-10-CM | POA: Diagnosis not present

## 2021-12-06 DIAGNOSIS — Z3200 Encounter for pregnancy test, result unknown: Secondary | ICD-10-CM | POA: Diagnosis not present

## 2021-12-06 DIAGNOSIS — Z131 Encounter for screening for diabetes mellitus: Secondary | ICD-10-CM | POA: Diagnosis not present

## 2021-12-06 DIAGNOSIS — E66813 Obesity, class 3: Secondary | ICD-10-CM

## 2021-12-06 DIAGNOSIS — E669 Obesity, unspecified: Secondary | ICD-10-CM | POA: Diagnosis not present

## 2021-12-06 DIAGNOSIS — Z13220 Encounter for screening for lipoid disorders: Secondary | ICD-10-CM

## 2021-12-06 NOTE — Patient Instructions (Signed)
Breast Self-Awareness Breast self-awareness means being familiar with how your breasts look and feel. It involves checking your breasts regularly and reporting any changes to your health care provider. Practicing breast self-awareness is important. Sometimes changes may not be harmful (are benign), but sometimes a change in your breasts can be a sign of a serious medical problem. It is important to learn how to do this procedure correctly so that you can catch problems early, when treatment is more likely to be successful. All women should practice breast self-awareness, including women who have had breast implants. What you need: A mirror. A well-lit room. How to do a breast self-exam A breast self-exam is one way to learn what is normal for your breasts and whether your breasts are changing. To do a breast self-exam: Look for changes  Remove all the clothing above your waist. Stand in front of a mirror in a room with good lighting. Put your hands on your hips. Push your hands firmly downward. Compare your breasts in the mirror. Look for differences between them (asymmetry), such as: Differences in shape. Differences in size. Puckers, dips, and bumps in one breast and not the other. Look at each breast for changes in the skin, such as: Redness. Scaly areas. Look for changes in your nipples, such as: Discharge. Bleeding. Dimpling. Redness. A change in position. Feel for changes Carefully feel your breasts for lumps and changes. It is best to do this while lying on your back on the floor, and again while sitting or standing in the tub or shower with soapy water on your skin. Feel each breast in the following way: Place the arm on the side of the breast you are examining above your head. Feel your breast with the other hand. Start in the nipple area and make -inch (2 cm) overlapping circles to feel your breast. Use the pads of your three middle fingers to do this. Apply light pressure,  then medium pressure, then firm pressure. The light pressure will allow you to feel the tissue closest to the skin. The medium pressure will allow you to feel the tissue that is a little deeper. The firm pressure will allow you to feel the tissue close to the ribs. Continue the overlapping circles, moving downward over the breast until you feel your ribs below your breast. Move one finger-width toward the center of the body. Continue to use the -inch (2 cm) overlapping circles to feel your breast as you move slowly up toward your collarbone. Continue the up-and-down exam using all three pressures until you reach your armpit.  Write down what you find Writing down what you find can help you remember what to discuss with your health care provider. Write down: What is normal for each breast. Any changes that you find in each breast, including: The kind of changes you find. Any pain or tenderness. Size and location of any lumps. Where you are in your menstrual cycle, if you are still menstruating. General tips and recommendations Examine your breasts every month. If you are breastfeeding, the best time to examine your breasts is after a feeding or after using a breast pump. If you menstruate, the best time to examine your breasts is 5-7 days after your period. Breasts are generally lumpier during menstrual periods, and it may be more difficult to notice changes. With time and practice, you will become more familiar with the variations in your breasts and more comfortable with the exam. Contact a health care provider if you:  See a change in the shape or size of your breasts or nipples. °See a change in the skin of your breast or nipples, such as a reddened or scaly area. °Have unusual discharge from your nipples. °Find a lump or thick area that was not there before. °Have pain in your breasts. °Have any concerns related to your breast health. °Summary °Breast self-awareness includes looking for  physical changes in your breasts, as well as feeling for any changes within your breasts. °Breast self-awareness should be performed in front of a mirror in a well-lit room. °You should examine your breasts every month. If you menstruate, the best time to examine your breasts is 5-7 days after your menstrual period. °Let your health care provider know of any changes you notice in your breasts, including changes in size, changes on the skin, pain or tenderness, or unusual fluid from your nipples. °This information is not intended to replace advice given to you by your health care provider. Make sure you discuss any questions you have with your health care provider. °Document Revised: 05/13/2018 Document Reviewed: 05/13/2018 °Elsevier Patient Education © 2022 Elsevier Inc. °Preventive Care 21-39 Years Old, Female °Preventive care refers to lifestyle choices and visits with your health care provider that can promote health and wellness. Preventive care visits are also called wellness exams. °What can I expect for my preventive care visit? °Counseling °During your preventive care visit, your health care provider may ask about your: °Medical history, including: °Past medical problems. °Family medical history. °Pregnancy history. °Current health, including: °Menstrual cycle. °Method of birth control. °Emotional well-being. °Home life and relationship well-being. °Sexual activity and sexual health. °Lifestyle, including: °Alcohol, nicotine or tobacco, and drug use. °Access to firearms. °Diet, exercise, and sleep habits. °Work and work environment. °Sunscreen use. °Safety issues such as seatbelt and bike helmet use. °Physical exam °Your health care provider may check your: °Height and weight. These may be used to calculate your BMI (body mass index). BMI is a measurement that tells if you are at a healthy weight. °Waist circumference. This measures the distance around your waistline. This measurement also tells if you are  at a healthy weight and may help predict your risk of certain diseases, such as type 2 diabetes and high blood pressure. °Heart rate and blood pressure. °Body temperature. °Skin for abnormal spots. °What immunizations do I need? °Vaccines are usually given at various ages, according to a schedule. Your health care provider will recommend vaccines for you based on your age, medical history, and lifestyle or other factors, such as travel or where you work. °What tests do I need? °Screening °Your health care provider may recommend screening tests for certain conditions. This may include: °Pelvic exam and Pap test. °Lipid and cholesterol levels. °Diabetes screening. This is done by checking your blood sugar (glucose) after you have not eaten for a while (fasting). °Hepatitis B test. °Hepatitis C test. °HIV (human immunodeficiency virus) test. °STI (sexually transmitted infection) testing, if you are at risk. °BRCA-related cancer screening. This may be done if you have a family history of breast, ovarian, tubal, or peritoneal cancers. °Talk with your health care provider about your test results, treatment options, and if necessary, the need for more tests. °Follow these instructions at home: °Eating and drinking ° °Eat a healthy diet that includes fresh fruits and vegetables, whole grains, lean protein, and low-fat dairy products. °Take vitamin and mineral supplements as recommended by your health care provider. °Do not drink alcohol if: °Your health care   provider tells you not to drink. You are pregnant, may be pregnant, or are planning to become pregnant. If you drink alcohol: Limit how much you have to 0-1 drink a day. Know how much alcohol is in your drink. In the [place] one drink equals one 12 oz bottle of beer (355 mL), one 5 oz glass of wine (148 mL), or one 1 oz glass of hard liquor (44 mL). Lifestyle Brush your teeth every morning and night with fluoride toothpaste. Floss one time each day. Exercise for  at least 30 minutes 5 or more days each week. Do not use any products that contain nicotine or tobacco. These products include cigarettes, chewing tobacco, and vaping devices, such as e-cigarettes. If you need help quitting, ask your health care provider. Do not use drugs. If you are sexually active, practice safe sex. Use a condom or other form of protection to prevent STIs. If you do not wish to become pregnant, use a form of birth control. If you plan to become pregnant, see your health care provider for a prepregnancy visit. Find healthy ways to manage stress, such as: Meditation, yoga, or listening to music. Journaling. Talking to a trusted person. Spending time with friends and family. Minimize exposure to UV radiation to reduce your risk of skin cancer. Safety Always wear your seat belt while driving or riding in a vehicle. Do not drive: If you have been drinking alcohol. Do not ride with someone who has been drinking. If you have been using any mind-altering substances or drugs. While texting. When you are tired or distracted. Wear a helmet and other protective equipment during sports activities. If you have firearms in your house, make sure you follow all gun safety procedures. Seek help if you have been physically or sexually abused. What's next? Go to your health care provider once a year for an annual wellness visit. Ask your health care provider how often you should have your eyes and teeth checked. Stay up to date on all vaccines. This information is not intended to replace advice given to you by your health care provider. Make sure you discuss any questions you have with your health care provider. Document Revised: 03/22/2021 Document Reviewed: 03/22/2021 Elsevier Patient Education  [place].

## 2021-12-06 NOTE — Progress Notes (Signed)
GYNECOLOGY ANNUAL PHYSICAL EXAM PROGRESS NOTE  Subjective:    Melanie Frederick is a 31 y.o. G0P0000 female who presents for an annual exam.  The patient is sexually active. The patient participates in regular exercise: no. Has the patient ever been transfused or tattooed?: yes. The patient reports that there is not domestic violence in her life.   The patient has the following complaints today.  She thinks she may have conceived, thinks it is to soon to tell. She has been having some cramping, but not sure if it is because she maybe pregnant or because her period is soon to come. Noting some breast tenderness as well.  Has been tracking ovulation. Next cycle due in several days so awaiting if this will occur.  Took a UPT from [employer] which was negative several days ago.   Menstrual History: Menarche age: 67 or 47 Patient's last menstrual period was 11/10/2021 (exact date). Period Cycle (Days): 28 Period Duration (Days): 5 days Period Pattern: Regular Menstrual Flow: Moderate Menstrual Control: Maxi pad, Tampon Menstrual Control Change Freq (Hours): 3-4 Dysmenorrhea: (!) Mild Dysmenorrhea Symptoms: Cramping, Diarrhea  Gynecologic History:  Contraception: none History of STI's: Denies Last Pap: 02/20/2018. Results were: normal. Denies h/o abnormal pap smears. Last mammogram: Not age appropriate   OB History  Gravida Para Term Preterm AB Living  0 0 0 0 0 0  SAB IAB Ectopic Multiple Live Births  0 0 0 0 0    Past Medical History:  Diagnosis Date   Anxiety    Fatigue    History of COVID-19 11/24/2020   Diagnosed January 2022   Nevus    Vaccine for human papilloma virus (HPV) types 6, 11, 16, and 18 administered     Past Surgical History:  Procedure Laterality Date   ARM SKIN LESION BIOPSY / EXCISION Left 2012   LYMPH NODE DISSECTION Right 2011   mold removal Left    moval removal off of back    Family History  Problem Relation Age of Onset    Hypothyroidism Paternal Grandmother    Brain cancer Other        malignant, great mat uncle-60   Cancer Other 38       colon cancer   Kidney disease Mother    Anemia Mother     Social History   Socioeconomic History   Marital status: Single    Spouse name: Not on file   Number of children: Not on file   Years of education: Not on file   Highest education level: Not on file  Occupational History   Not on file  Tobacco Use   Smoking status: Never   Smokeless tobacco: Never  Vaping Use   Vaping Use: Never used  Substance and Sexual Activity   Alcohol use: Yes    Comment: occasinally    Drug use: No   Sexual activity: Yes    Birth control/protection: Condom  Other Topics Concern   Not on file  Social History Narrative   Not on file   Social Determinants of Health   Financial Resource Strain: Not on file  Food Insecurity: Not on file  Transportation Needs: Not on file  Physical Activity: Not on file  Stress: Not on file  Social Connections: Not on file  Intimate Partner Violence: Not on file    Current Outpatient Medications on File Prior to Visit  Medication Sig Dispense Refill   albuterol (VENTOLIN HFA) 108 (90 Base) MCG/ACT inhaler  As needed (Patient not taking: Reported on 10/17/2021)     brompheniramine-pseudoephedrine-DM 30-2-10 MG/5ML syrup Take 5 mLs by mouth 4 (four) times daily as needed. Mix with 5 mL of viscous lidocaine for swish and swallow (Patient not taking: Reported on 11/20/2021) 120 mL 0   lidocaine (XYLOCAINE) 2 % solution Use as directed 5 mLs in the mouth or throat every 6 (six) hours as needed for mouth pain. Mix with 5 mL of Bromfed-DM for swish and swallow (Patient not taking: Reported on 11/20/2021) 100 mL 0   sertraline (ZOLOFT) 50 MG tablet TAKE 1 TABLET BY MOUTH DAILY. BEGIN WITH 1/2 TABLET X 2 WEEKS, THEN INCREASE TO 1 WHOLE TABLET. 90 tablet 1   No current facility-administered medications on file prior to visit.    Allergies  Allergen  Reactions   Mushroom Extract Complex Hives and Swelling   Prednisone Palpitations     Review of Systems Constitutional: negative for chills, fatigue, fevers and sweats Eyes: negative for irritation, redness and visual disturbance Ears, nose, mouth, throat, and face: negative for hearing loss, nasal congestion, snoring and tinnitus Respiratory: negative for asthma, cough, sputum Cardiovascular: negative for chest pain, dyspnea, exertional chest pressure/discomfort, irregular heart beat, palpitations and syncope Gastrointestinal: negative for abdominal pain, change in bowel habits, nausea and vomiting Genitourinary: negative for abnormal menstrual periods, genital lesions, sexual problems and vaginal discharge, dysuria and urinary incontinence Integument/breast: negative for breast lump, breast tenderness and nipple discharge Hematologic/lymphatic: negative for bleeding and easy bruising Musculoskeletal:negative for back pain and muscle weakness Neurological: negative for dizziness, headaches, vertigo and weakness Endocrine: negative for diabetic symptoms including polydipsia, polyuria and skin dryness Allergic/Immunologic: negative for hay fever and urticaria      Objective:   Blood pressure 117/83, pulse 69, resp. rate 16, height 5\' 7"  (1.702 m), weight 264 lb 9.6 oz (120 kg), last menstrual period 11/10/2021. Body mass index is 41.44 kg/m.    General Appearance:    Alert, cooperative, no distress, appears stated age, obesity (Class III)  Head:    Normocephalic, without obvious abnormality, atraumatic  Eyes:    PERRL, conjunctiva/corneas clear, EOM's intact, both eyes  Ears:    Normal external ear canals, both ears  Nose:   Nares normal, septum midline, mucosa normal, no drainage or sinus tenderness  Throat:   Lips, mucosa, and tongue normal; teeth and gums normal  Neck:   Supple, symmetrical, trachea midline, no adenopathy; thyroid: no enlargement/tenderness/nodules; no carotid  bruit or JVD  Back:     Symmetric, no curvature, ROM normal, no CVA tenderness  Lungs:     Clear to auscultation bilaterally, respirations unlabored  Chest Wall:    No tenderness or deformity   Heart:    Regular rate and rhythm, S1 and S2 normal, no murmur, rub or gallop  Breast Exam:    No tenderness, masses, or nipple abnormality  Abdomen:     Soft, non-tender, bowel sounds active all four quadrants, no masses, no organomegaly.    Genitalia:    Pelvic:external genitalia normal, vagina without lesions, discharge, or tenderness, rectovaginal septum  normal. Cervix normal in appearance, no cervical motion tenderness, no adnexal masses or tenderness.  Uterus normal size, shape, mobile, regular contours, nontender.  Rectal:    Normal external sphincter.  No hemorrhoids appreciated. Internal exam not done.   Extremities:   Extremities normal, atraumatic, no cyanosis or edema  Pulses:   2+ and symmetric all extremities  Skin:   Skin color, texture, turgor normal, no  rashes or lesions  Lymph nodes:   Cervical, supraclavicular, and axillary nodes normal  Neurologic:   CNII-XII intact, normal strength, sensation and reflexes throughout   .  Labs:  Lab Results  Component Value Date   WBC 6.4 10/13/2020   HGB 14.6 10/13/2020   HCT 44.6 10/13/2020   MCV 93.5 10/13/2020   PLT 312 10/13/2020    Lab Results  Component Value Date   CREATININE 0.58 10/13/2020   BUN 11 10/13/2020   NA 140 10/13/2020   K 4.1 10/13/2020   CL 109 10/13/2020   CO2 24 10/13/2020    Lab Results  Component Value Date   ALT 15 10/13/2020   AST 19 10/13/2020   ALKPHOS 78 10/13/2020   BILITOT 0.5 10/13/2020    Lab Results  Component Value Date   TSH 2.490 11/17/2019     Assessment:   1. Encounter for well woman exam with routine gynecological exam   2. Cervical cancer screening   3. Obesity, Class III, BMI 40-49.9 (morbid obesity) ([place])   4. Screening for diabetes mellitus   5. Screening for lipid  disorders   6. Possible pregnancy, not confirmed      Plan:  Blood tests: CBC with diff, Comprehensive metabolic panel, Lipoproteins, Quantitative hCG, Total cholesterol, TSH, and HgbA1c. Breast self exam technique reviewed and patient encouraged to perform self-exam monthly. Contraception: none. Discussed healthy lifestyle modifications. Mammogram  Not age appropriate Pap smear ordered. Patient thinks she may possibly be pregnant. Too early for UPT but can acquire serum level.  Follow up in 1 year for annual exam. To f/u sooner if pregnancy is established.     Rubie Maid, MD [facility]

## 2021-12-07 LAB — COMPREHENSIVE METABOLIC PANEL
ALT: 14 IU/L (ref 0–32)
AST: 19 IU/L (ref 0–40)
Albumin/Globulin Ratio: 1.5 (ref 1.2–2.2)
Albumin: 4.1 g/dL (ref 3.9–5.0)
Alkaline Phosphatase: 77 IU/L (ref 44–121)
BUN/Creatinine Ratio: 9 (ref 9–23)
BUN: 7 mg/dL (ref 6–20)
Bilirubin Total: 0.4 mg/dL (ref 0.0–1.2)
CO2: 20 mmol/L (ref 20–29)
Calcium: 9 mg/dL (ref 8.7–10.2)
Chloride: 102 mmol/L (ref 96–106)
Creatinine, Ser: 0.81 mg/dL (ref 0.57–1.00)
Globulin, Total: 2.8 g/dL (ref 1.5–4.5)
Glucose: 87 mg/dL (ref 70–99)
Potassium: 4.8 mmol/L (ref 3.5–5.2)
Sodium: 138 mmol/L (ref 134–144)
Total Protein: 6.9 g/dL (ref 6.0–8.5)
eGFR: 101 mL/min/{1.73_m2} (ref >59)

## 2021-12-07 LAB — LIPID PANEL
Chol/HDL Ratio: 3.7 ratio (ref 0.0–4.4)
Cholesterol, Total: 209 mg/dL — ABNORMAL HIGH (ref 100–199)
HDL: 57 mg/dL (ref >39)
LDL Chol Calc (NIH): 133 mg/dL — ABNORMAL HIGH (ref 0–99)
Triglycerides: 106 mg/dL (ref 0–149)
VLDL Cholesterol Cal: 19 mg/dL (ref 5–40)

## 2021-12-07 LAB — CBC
Hematocrit: 41.3 % (ref 34.0–46.6)
Hemoglobin: 13.8 g/dL (ref 11.1–15.9)
MCH: 31 pg (ref 26.6–33.0)
MCHC: 33.4 g/dL (ref 31.5–35.7)
MCV: 93 fL (ref 79–97)
Platelets: 301 10*3/uL (ref 150–450)
RBC: 4.45 x10E6/uL (ref 3.77–5.28)
RDW: 11.5 % — ABNORMAL LOW (ref 11.7–15.4)
WBC: 5.4 10*3/uL (ref 3.4–10.8)

## 2021-12-07 LAB — BETA HCG QUANT (REF LAB): hCG Quant: <1 m[IU]/mL

## 2021-12-07 LAB — HEMOGLOBIN A1C
Est. average glucose Bld gHb Est-mCnc: 100 mg/dL
Hgb A1c MFr Bld: 5.1 % (ref 4.8–5.6)

## 2021-12-07 LAB — TSH: TSH: 1.53 u[IU]/mL (ref 0.450–4.500)

## 2021-12-08 LAB — CYTOLOGY - PAP: Diagnosis: NEGATIVE

## 2021-12-16 DIAGNOSIS — G4730 Sleep apnea, unspecified: Secondary | ICD-10-CM | POA: Diagnosis not present

## 2021-12-21 ENCOUNTER — Encounter: Payer: Self-pay | Admitting: Obstetrics and Gynecology

## 2021-12-26 DIAGNOSIS — R0683 Snoring: Secondary | ICD-10-CM

## 2022-01-11 ENCOUNTER — Encounter: Payer: 59 | Admitting: Obstetrics and Gynecology

## 2022-02-16 DIAGNOSIS — R0789 Other chest pain: Secondary | ICD-10-CM | POA: Diagnosis not present

## 2022-02-16 DIAGNOSIS — Z91018 Allergy to other foods: Secondary | ICD-10-CM | POA: Diagnosis not present

## 2022-02-16 DIAGNOSIS — R079 Chest pain, unspecified: Secondary | ICD-10-CM | POA: Diagnosis not present

## 2022-02-16 DIAGNOSIS — R0602 Shortness of breath: Secondary | ICD-10-CM | POA: Diagnosis not present

## 2022-04-23 ENCOUNTER — Other Ambulatory Visit: Payer: 59

## 2022-04-23 ENCOUNTER — Ambulatory Visit (INDEPENDENT_AMBULATORY_CARE_PROVIDER_SITE_OTHER): Payer: 59 | Admitting: Obstetrics and Gynecology

## 2022-04-23 VITALS — Ht 67.0 in

## 2022-04-23 DIAGNOSIS — Z3200 Encounter for pregnancy test, result unknown: Secondary | ICD-10-CM

## 2022-04-23 LAB — POCT URINE PREGNANCY: Preg Test, Ur: NEGATIVE

## 2022-04-23 NOTE — Progress Notes (Signed)
Pt presents for pregnancy confirmation done on 04/23/22, UPT NEGATIVE. Pt states that she had positive at home UPT with a very faint line followed by a later than normal period starting on 03/09/22 that was 'abnormally heavy with lots of clotting' per patient. Patient requested a beta quant for potential pregnancy follow up, labs to be drawn today at patient request. All questions answered.

## 2022-04-24 DIAGNOSIS — H5213 Myopia, bilateral: Secondary | ICD-10-CM | POA: Diagnosis not present

## 2022-04-24 LAB — BETA HCG QUANT (REF LAB): hCG Quant: <1 m[IU]/mL

## 2022-04-24 NOTE — Progress Notes (Signed)
Information/results relayed to patient via mychart with instructions to f/u with any questions/concerns.

## 2022-04-24 NOTE — Progress Notes (Signed)
BHCG labs was negative. May have been a false positive home UPT.

## 2022-06-07 ENCOUNTER — Other Ambulatory Visit: Payer: Self-pay | Admitting: Obstetrics and Gynecology

## 2022-06-22 ENCOUNTER — Other Ambulatory Visit (HOSPITAL_COMMUNITY)
Admission: RE | Admit: 2022-06-22 | Discharge: 2022-06-22 | Disposition: A | Discharge status: Home / Self Care | Payer: 59 | Source: Ambulatory Visit | Attending: Obstetrics and Gynecology | Admitting: Obstetrics and Gynecology

## 2022-06-22 ENCOUNTER — Encounter: Payer: Self-pay | Admitting: Obstetrics

## 2022-06-22 ENCOUNTER — Ambulatory Visit (INDEPENDENT_AMBULATORY_CARE_PROVIDER_SITE_OTHER): Payer: 59 | Admitting: Obstetrics

## 2022-06-22 VITALS — BP 127/86 | HR 96 | Ht 64.0 in | Wt 287.7 lb

## 2022-06-22 DIAGNOSIS — N939 Abnormal uterine and vaginal bleeding, unspecified: Secondary | ICD-10-CM | POA: Diagnosis not present

## 2022-06-22 DIAGNOSIS — A5901 Trichomonal vulvovaginitis: Secondary | ICD-10-CM | POA: Insufficient documentation

## 2022-06-22 NOTE — Progress Notes (Signed)
GYN ENCOUNTER  Subjective  HPI: Melanie Frederick is a 31 y.o. G0P0000 who presents today for evaluation of mid-cycle cramping and spotting. She reports that she has been having brown and pink spotting when wiping and after intercourse. She also has had some abdominal pain and cramping that comes and goes. She reports she is having normal bowel movements. She recently had a yeast infection that she treated with 1-day Monistat. She denies vaginal odor or itching. She denies UTI symptoms. She reports that her last period was heavier than normal with clots. She and her boyfriend are trying to conceive so she is not using any contraception.  Past Medical History:  Diagnosis Date   Anxiety    Fatigue    History of COVID-19 11/24/2020   Diagnosed January 2022   Nevus    Vaccine for human papilloma virus (HPV) types 6, 11, 16, and 18 administered    Past Surgical History:  Procedure Laterality Date   ARM SKIN LESION BIOPSY / EXCISION Left 2012   LYMPH NODE DISSECTION Right 2011   mold removal Left    moval removal off of back   OB History     Gravida  0   Para  0   Term  0   Preterm  0   AB  0   Living  0      SAB  0   IAB  0   Ectopic  0   Multiple  0   Live Births  0          Allergies  Allergen Reactions   Mushroom Extract Complex Hives and Swelling   Prednisone Palpitations   ROS: Negative except as noted in HPI History obtained from the patient  Objective  BP 127/86   Pulse 96   Ht '5\' 4"'$  (1.626 m)   Wt 287 lb 11.2 oz (130.5 kg)   LMP 06/08/2022 (Approximate)   BMI 49.38 kg/m   General appearance: alert, cooperative Lungs: clear to auscultation bilaterally Heart: regular rate and rhythm, S1, S2 normal, no murmur, click, rub or gallop Abdomen: soft, non-tender. Bowel sounds normal. No masses,  no organomegaly Pelvic: External genitalia normal, Vagina normal without discharge, no adnexal tenderness, cervix appears mildly inflamed with light  yellow discharge; no bleeding noted. Swab collected.  Assessment 1) Midcycle cramping and spotting - infection versus ovulation pain  Plan 1) Swabs collected. F/u based on results. Consider GI causes of abdominal pain. Pelvic US if pain worsens or does not improve.   Lloyd Huger, CNM

## 2022-06-26 ENCOUNTER — Encounter: Payer: Self-pay | Admitting: Physician Assistant

## 2022-06-26 ENCOUNTER — Encounter: Payer: Self-pay | Admitting: Obstetrics

## 2022-06-26 ENCOUNTER — Ambulatory Visit: Payer: Self-pay | Admitting: Physician Assistant

## 2022-06-26 ENCOUNTER — Other Ambulatory Visit: Payer: Self-pay | Admitting: Obstetrics

## 2022-06-26 VITALS — BP 122/71 | HR 77 | Temp 97.7°F | Resp 14 | Ht 67.0 in | Wt 280.0 lb

## 2022-06-26 DIAGNOSIS — H6981 Other specified disorders of Eustachian tube, right ear: Secondary | ICD-10-CM

## 2022-06-26 DIAGNOSIS — R0981 Nasal congestion: Secondary | ICD-10-CM

## 2022-06-26 LAB — CERVICOVAGINAL ANCILLARY ONLY
Bacterial Vaginitis (gardnerella): NEGATIVE
Candida Glabrata: NEGATIVE
Candida Vaginitis: NEGATIVE
Chlamydia: NEGATIVE
Comment: NEGATIVE
Comment: NEGATIVE
Comment: NEGATIVE
Comment: NEGATIVE
Comment: NEGATIVE
Comment: NORMAL
Neisseria Gonorrhea: NEGATIVE
Trichomonas: POSITIVE — AB

## 2022-06-26 MED ORDER — FEXOFENADINE-PSEUDOEPHED ER 60-120 MG PO TB12: 1.0000 | ORAL_TABLET | Freq: Two times a day (BID) | ORAL | 0 refills | Status: DC

## 2022-06-26 MED ORDER — METRONIDAZOLE 500 MG PO TABS: 500.0000 mg | ORAL_TABLET | Freq: Two times a day (BID) | ORAL | 0 refills | Status: DC

## 2022-06-26 MED ORDER — NEOMYCIN-POLYMYXIN-HC 3.5-10000-1 OT SOLN: 3.0000 [drp] | Freq: Four times a day (QID) | OTIC | 0 refills | Status: DC

## 2022-06-26 NOTE — Progress Notes (Signed)
Right ear pain and itching for 3-2 days. Pt states her allergies have been bad.

## 2022-06-26 NOTE — Progress Notes (Signed)
   Subjective: Right hip pain    Patient ID: Melanie Frederick, female    DOB: 06-13-1991, 31 y.o.   MRN: [id number]  HPI Patient complain of right hip pain for 2 to 3 days.  Patient also complain of nasal congestion.  Patient denies hearing loss or vertigo.   Review of Systems Negative except for chief complaint    Objective:   Physical Exam  BP is 122/71, pulse 77, respiration 14, temp 97.7, patient 96% O2 sat on room air.  Patient weighs 280 pounds and BMI is 43.85. HEENT is remarkable for edematous bilateral TMs.  Also mild erythema in the right ear.  Patient has bilateral maxillary guarding with edematous nasal turbinates.      Assessment & Plan: Nasal congestion and eustachian tube dysfunction  Take medication as directed and follow-up in 5 days if no improvement or worsening complaint.

## 2022-06-26 NOTE — Progress Notes (Signed)
+   trichomoniasis. Keenya notified via [place]. Metronidazole sent to pharmacy. Informed pt that partner needs to be treated, and she can contact me if needed for rx.  Lloyd Huger, CNM

## 2022-06-27 ENCOUNTER — Other Ambulatory Visit: Payer: Self-pay

## 2022-06-27 DIAGNOSIS — A599 Trichomoniasis, unspecified: Secondary | ICD-10-CM

## 2022-06-27 MED ORDER — METRONIDAZOLE 500 MG PO TABS: 500.0000 mg | ORAL_TABLET | Freq: Two times a day (BID) | ORAL | 0 refills | Status: DC

## 2022-07-10 ENCOUNTER — Encounter: Payer: 59 | Admitting: Obstetrics and Gynecology

## 2022-07-18 ENCOUNTER — Encounter: Payer: Self-pay | Admitting: Obstetrics and Gynecology

## 2022-07-20 ENCOUNTER — Ambulatory Visit (INDEPENDENT_AMBULATORY_CARE_PROVIDER_SITE_OTHER): Payer: 59

## 2022-07-20 ENCOUNTER — Other Ambulatory Visit (HOSPITAL_COMMUNITY)
Admission: RE | Admit: 2022-07-20 | Discharge: 2022-07-20 | Disposition: A | Discharge status: Home / Self Care | Payer: 59 | Source: Ambulatory Visit | Attending: Obstetrics and Gynecology | Admitting: Obstetrics and Gynecology

## 2022-07-20 VITALS — BP 138/75 | HR 67 | Wt 290.0 lb

## 2022-07-20 DIAGNOSIS — A599 Trichomoniasis, unspecified: Secondary | ICD-10-CM

## 2022-07-25 LAB — CERVICOVAGINAL ANCILLARY ONLY
Bacterial Vaginitis (gardnerella): NEGATIVE
Candida Glabrata: NEGATIVE
Candida Vaginitis: NEGATIVE
Chlamydia: NEGATIVE
Comment: NEGATIVE
Comment: NEGATIVE
Comment: NEGATIVE
Comment: NEGATIVE
Comment: NEGATIVE
Comment: NORMAL
Neisseria Gonorrhea: NEGATIVE
Trichomonas: NEGATIVE

## 2022-07-26 ENCOUNTER — Ambulatory Visit (INDEPENDENT_AMBULATORY_CARE_PROVIDER_SITE_OTHER): Payer: 59 | Admitting: Obstetrics and Gynecology

## 2022-07-26 VITALS — BP 131/87 | HR 87 | Resp 16 | Ht 67.0 in | Wt 296.7 lb

## 2022-07-26 DIAGNOSIS — N898 Other specified noninflammatory disorders of vagina: Secondary | ICD-10-CM | POA: Diagnosis not present

## 2022-07-26 DIAGNOSIS — N923 Ovulation bleeding: Secondary | ICD-10-CM | POA: Diagnosis not present

## 2022-07-26 DIAGNOSIS — Z8619 Personal history of other infectious and parasitic diseases: Secondary | ICD-10-CM | POA: Diagnosis not present

## 2022-07-26 NOTE — Progress Notes (Signed)
    GYNECOLOGY PROGRESS NOTE  Subjective:    Patient ID: Melanie Frederick, female    DOB: Jun 13, 1991, 31 y.o.   MRN: [id number]  HPI  Patient is a 31 y.o. G0P0000 female who presents for spotting between menstrual cycles.  This has been ongoing for the past 2 months. Spotting typically lasts for 1-2 days. Recently treated for trichomoniasis with recent TOC negative, but still noting some vaginal irritation.  Notes that this occurs mostly after cycles.   The following portions of the patient's history were reviewed and updated as appropriate: allergies, current medications, past family history, past medical history, past social history, past surgical history, and problem list.  Review of Systems Pertinent items noted in HPI and remainder of comprehensive ROS otherwise negative.   Objective:   Blood pressure 131/87, pulse 87, resp. rate 16, height '5\' 7"'$  (1.702 m), weight 296 lb 11.2 oz (134.6 kg), last menstrual period 07/08/2022.  There is no height or weight on file to calculate BMI. General appearance: alert and no distress Abdomen: soft, non-tender; bowel sounds normal; no masses,  no organomegaly Pelvic: external genitalia normal, rectovaginal septum normal.  Vagina with scant thin white vaginal discharge.  Cervix normal appearing, no lesions and no motion tenderness.  Uterus mobile, nontender, normal shape and size.  Adnexae non-palpable, nontender bilaterally.  Extremities: extremities normal, atraumatic, no cyanosis or edema Neurologic: Grossly normal   Labs:  Clinical Support on 07/20/2022  Component Date Value Ref Range Status   Neisseria Gonorrhea 07/20/2022 Negative   Final   Chlamydia 07/20/2022 Negative   Final   Trichomonas 07/20/2022 Negative   Final   Bacterial Vaginitis (gardnerella) 07/20/2022 Negative   Final   Candida Vaginitis 07/20/2022 Negative   Final   Candida Glabrata 07/20/2022 Negative   Final   Comment 07/20/2022 Normal Reference Range Candida Species -  Negative   Final   Comment 07/20/2022 Normal Reference Range Candida Galbrata - Negative   Final   Comment 07/20/2022 Normal Reference Range Trichomonas - Negative   Final   Comment 07/20/2022 Normal Reference Ranger Chlamydia - Negative   Final   Comment 07/20/2022 Normal Reference Range Neisseria Gonorrhea - Negative   Final   Comment 07/20/2022 Normal Reference Range Bacterial Vaginosis - Negative   Final    Assessment:   1. Intermenstrual spotting   2. History of trichomoniasis   3. Vaginal irritation      Plan:   1. Intermenstrual spotting - Patient with intermenstrual spotting, unkown cause. Recent culture negative for infection, TOC clear for trichomoniaisis. Advised on continuing to monitor symptoms. May be hormonal, or could be due to structural cause (endometrial polyp or fibroid). If no  resolution, can consider further workup with pelvic ultrasound.   2. History of trichomoniasis - Recent TOC negative for trichomoniasis Advised that infection has cleared.   3. Vaginal irritation - Unclear cause but may be due to hormonal changes after cycle, or use of pads/tampons.  Given samples of boric acid suppositories to utilize after cycles.    Return to clinic if symptoms worsen or fail to improve.    Rubie Maid, MD [facility]

## 2022-07-27 ENCOUNTER — Encounter: Payer: Self-pay | Admitting: Obstetrics and Gynecology

## 2022-08-17 ENCOUNTER — Ambulatory Visit: Payer: 59

## 2022-08-17 ENCOUNTER — Ambulatory Visit (INDEPENDENT_AMBULATORY_CARE_PROVIDER_SITE_OTHER): Payer: 59

## 2022-08-17 VITALS — BP 137/83 | HR 80 | Ht 67.0 in | Wt 294.0 lb

## 2022-08-17 DIAGNOSIS — Z3200 Encounter for pregnancy test, result unknown: Secondary | ICD-10-CM

## 2022-08-17 DIAGNOSIS — Z3201 Encounter for pregnancy test, result positive: Secondary | ICD-10-CM

## 2022-08-17 LAB — POCT URINE PREGNANCY: Preg Test, Ur: POSITIVE — AB

## 2022-08-17 NOTE — Progress Notes (Signed)
Patient presents for evaluation of amenorrhea. She believes she could be pregnant.  Current symptoms also include,nasuea and breast tenderness.  Urine NLW:[id number]  Briefly discussed pre-natal care options. She will schedule her appointments at check out.  LMP: Around begging of October but is unsure.

## 2022-09-03 ENCOUNTER — Ambulatory Visit (INDEPENDENT_AMBULATORY_CARE_PROVIDER_SITE_OTHER): Payer: 59

## 2022-09-03 VITALS — BP 127/92 | Ht 68.0 in | Wt 295.0 lb

## 2022-09-03 DIAGNOSIS — Z1379 Encounter for other screening for genetic and chromosomal anomalies: Secondary | ICD-10-CM

## 2022-09-03 DIAGNOSIS — Z3400 Encounter for supervision of normal first pregnancy, unspecified trimester: Secondary | ICD-10-CM | POA: Insufficient documentation

## 2022-09-03 DIAGNOSIS — Z3401 Encounter for supervision of normal first pregnancy, first trimester: Secondary | ICD-10-CM

## 2022-09-03 NOTE — Progress Notes (Addendum)
New OB Intake  I connected with  Melanie Frederick on 09/03/22 at 11:15 AM EST by telephone Video Visit and verified that I am speaking with the correct person using two identifiers. Nurse is located at [employer] and pt is located at home.  I discussed the limitations, risks, security and privacy concerns of performing an evaluation and management service by telephone and the availability of in person appointments. I also discussed with the patient that there may be a patient responsible charge related to this service. The patient expressed understanding and agreed to proceed.  I explained I am completing New OB Intake today. We discussed her EDD of 04/14/23 that is based on LMP of 07/08/2022. Pt is G1/P0. I reviewed her allergies, medications, Medical/Surgical/OB history, and appropriate screenings. Based on history, this is a/an pregnancy uncomplicated .   Patient Active Problem List   Diagnosis Date Noted   History of COVID-19 11/24/2020   Obesity (BMI 35.0-39.9 without comorbidity) 11/24/2020   Overweight 05/17/2020    Concerns addressed today  Delivery Plans:  Plans to deliver at [facility]  Patient would like  genetic testing to be drawn at lab apt Discussed possible labs to be drawn at new OB appointment.  COVID Vaccine Patient has not had COVID vaccine.   Social Determinants of Health Food Insecurity: denies food insecurity WIC Referral: Patient is interested in referral to [facility].  Transportation: Patient denies transportation needs. Childcare: Discussed no children allowed at ultrasound appointments.   First visit review I reviewed new OB appt with pt. I explained she will have ob bloodwork and pap smear/pelvic exam if indicated. Explained pt will be seen by Rubie Maid MD  at first visit; encounter routed to appropriate provider  Landis Gandy, [place] 09/03/2022  11:16 AM

## 2022-09-17 ENCOUNTER — Other Ambulatory Visit (HOSPITAL_COMMUNITY)
Admission: RE | Admit: 2022-09-17 | Discharge: 2022-09-17 | Disposition: A | Discharge status: Home / Self Care | Payer: 59 | Source: Ambulatory Visit | Attending: Obstetrics and Gynecology | Admitting: Obstetrics and Gynecology

## 2022-09-17 ENCOUNTER — Ambulatory Visit (INDEPENDENT_AMBULATORY_CARE_PROVIDER_SITE_OTHER): Payer: 59

## 2022-09-17 ENCOUNTER — Other Ambulatory Visit: Payer: Self-pay

## 2022-09-17 ENCOUNTER — Other Ambulatory Visit: Payer: 59

## 2022-09-17 DIAGNOSIS — Z3687 Encounter for antenatal screening for uncertain dates: Secondary | ICD-10-CM | POA: Diagnosis not present

## 2022-09-17 DIAGNOSIS — Z3401 Encounter for supervision of normal first pregnancy, first trimester: Secondary | ICD-10-CM | POA: Diagnosis not present

## 2022-09-17 DIAGNOSIS — Z3A09 9 weeks gestation of pregnancy: Secondary | ICD-10-CM | POA: Diagnosis not present

## 2022-09-18 ENCOUNTER — Encounter: Payer: Self-pay | Admitting: Obstetrics and Gynecology

## 2022-09-18 LAB — URINE CYTOLOGY ANCILLARY ONLY
Chlamydia: NEGATIVE
Comment: NEGATIVE
Comment: NORMAL
Neisseria Gonorrhea: NEGATIVE

## 2022-09-18 LAB — CBC/D/PLT+RPR+RH+ABO+RUBIGG...
Antibody Screen: NEGATIVE
Basophils Absolute: 0 10*3/uL (ref 0.0–0.2)
Basos: 1 %
EOS (ABSOLUTE): 0.1 10*3/uL (ref 0.0–0.4)
Eos: 2 %
HCV Ab: NONREACTIVE
HIV Screen 4th Generation wRfx: NONREACTIVE
Hematocrit: 39.3 % (ref 34.0–46.6)
Hemoglobin: 13.2 g/dL (ref 11.1–15.9)
Hepatitis B Surface Ag: NEGATIVE
Immature Grans (Abs): 0 10*3/uL (ref 0.0–0.1)
Immature Granulocytes: 0 %
Lymphocytes Absolute: 1.4 10*3/uL (ref 0.7–3.1)
Lymphs: 18 %
MCH: 30.8 pg (ref 26.6–33.0)
MCHC: 33.6 g/dL (ref 31.5–35.7)
MCV: 92 fL (ref 79–97)
Monocytes Absolute: 0.5 10*3/uL (ref 0.1–0.9)
Monocytes: 6 %
Neutrophils Absolute: 5.9 10*3/uL (ref 1.4–7.0)
Neutrophils: 73 %
Platelets: 399 10*3/uL (ref 150–450)
RBC: 4.28 x10E6/uL (ref 3.77–5.28)
RDW: 12 % (ref 11.7–15.4)
RPR Ser Ql: NONREACTIVE
Rh Factor: POSITIVE
Rubella Antibodies, IGG: <0.9 index — ABNORMAL LOW (ref >0.99)
Varicella zoster IgG: <135 index — ABNORMAL LOW (ref >165)
WBC: 8 10*3/uL (ref 3.4–10.8)

## 2022-09-18 LAB — MICROSCOPIC EXAMINATION
Casts: NONE SEEN /lpf
WBC, UA: >30 /hpf — AB (ref 0–5)

## 2022-09-18 LAB — URINALYSIS, ROUTINE W REFLEX MICROSCOPIC
Bilirubin, UA: NEGATIVE
Glucose, UA: NEGATIVE
Ketones, UA: NEGATIVE
Nitrite, UA: NEGATIVE
Protein,UA: NEGATIVE
RBC, UA: NEGATIVE
Specific Gravity, UA: 1.021 (ref 1.005–1.030)
Urobilinogen, Ur: 0.2 mg/dL (ref 0.2–1.0)
pH, UA: 6 (ref 5.0–7.5)

## 2022-09-18 LAB — HCV INTERPRETATION

## 2022-09-19 LAB — MONITOR DRUG PROFILE 14(MW)
Amphetamine Scrn, Ur: NEGATIVE ng/mL
BARBITURATE SCREEN URINE: NEGATIVE ng/mL
BENZODIAZEPINE SCREEN, URINE: NEGATIVE ng/mL
Buprenorphine, Urine: NEGATIVE ng/mL
CANNABINOIDS UR QL SCN: NEGATIVE ng/mL
Cocaine (Metab) Scrn, Ur: NEGATIVE ng/mL
Creatinine(Crt), U: 169.1 mg/dL (ref 20.0–300.0)
Fentanyl, Urine: NEGATIVE pg/mL
Meperidine Screen, Urine: NEGATIVE ng/mL
Methadone Screen, Urine: NEGATIVE ng/mL
OXYCODONE+OXYMORPHONE UR QL SCN: NEGATIVE ng/mL
Opiate Scrn, Ur: NEGATIVE ng/mL
Ph of Urine: 5.7 (ref 4.5–8.9)
Phencyclidine Qn, Ur: NEGATIVE ng/mL
Propoxyphene Scrn, Ur: NEGATIVE ng/mL
SPECIFIC GRAVITY: 1.018
Tramadol Screen, Urine: NEGATIVE ng/mL

## 2022-09-19 LAB — NICOTINE SCREEN, URINE: Cotinine Ql Scrn, Ur: NEGATIVE ng/mL

## 2022-09-19 LAB — CULTURE, OB URINE

## 2022-09-19 LAB — URINE CULTURE, OB REFLEX

## 2022-09-26 ENCOUNTER — Other Ambulatory Visit: Payer: 59

## 2022-09-26 ENCOUNTER — Ambulatory Visit (INDEPENDENT_AMBULATORY_CARE_PROVIDER_SITE_OTHER): Payer: 59 | Admitting: Obstetrics and Gynecology

## 2022-09-26 ENCOUNTER — Encounter: Payer: Self-pay | Admitting: Obstetrics and Gynecology

## 2022-09-26 ENCOUNTER — Other Ambulatory Visit (HOSPITAL_COMMUNITY)
Admission: RE | Admit: 2022-09-26 | Discharge: 2022-09-26 | Disposition: A | Discharge status: Home / Self Care | Payer: 59 | Source: Ambulatory Visit | Attending: Obstetrics and Gynecology | Admitting: Obstetrics and Gynecology

## 2022-09-26 VITALS — BP 115/75 | HR 85 | Ht 68.0 in | Wt 301.8 lb

## 2022-09-26 DIAGNOSIS — O99210 Obesity complicating pregnancy, unspecified trimester: Secondary | ICD-10-CM

## 2022-09-26 DIAGNOSIS — Z3A11 11 weeks gestation of pregnancy: Secondary | ICD-10-CM | POA: Insufficient documentation

## 2022-09-26 DIAGNOSIS — Z3401 Encounter for supervision of normal first pregnancy, first trimester: Secondary | ICD-10-CM | POA: Insufficient documentation

## 2022-09-26 DIAGNOSIS — N898 Other specified noninflammatory disorders of vagina: Secondary | ICD-10-CM | POA: Diagnosis not present

## 2022-09-26 DIAGNOSIS — Z3A10 10 weeks gestation of pregnancy: Secondary | ICD-10-CM | POA: Diagnosis not present

## 2022-09-26 DIAGNOSIS — Z1379 Encounter for other screening for genetic and chromosomal anomalies: Secondary | ICD-10-CM | POA: Diagnosis not present

## 2022-09-26 DIAGNOSIS — O219 Vomiting of pregnancy, unspecified: Secondary | ICD-10-CM

## 2022-09-26 LAB — POCT URINALYSIS DIPSTICK OB
Bilirubin, UA: NEGATIVE
Glucose, UA: NEGATIVE
Ketones, UA: NEGATIVE
Nitrite, UA: NEGATIVE
Spec Grav, UA: 1.01 (ref 1.010–1.025)
Urobilinogen, UA: 0.2 E.U./dL
pH, UA: 7.5 (ref 5.0–8.0)

## 2022-09-26 NOTE — Progress Notes (Signed)
OBSTETRIC INITIAL PRENATAL VISIT  Subjective:    Melanie Frederick is being seen today for her first obstetrical visit.  This is a planned/desired pregnancy. She is a 31 y.o. G1P0000 female at [redacted]w[redacted]d gestation, Estimated Date of Delivery: 04/22/23  with Patient's last menstrual period was 07/08/2022 (exact date).,  inconsistent with 9 week sono. Her obstetrical history is significant for obesity. Relationship with FOB: significant other, living together. Patient does intend to breast feed. Pregnancy history fully reviewed.    OB History  Gravida Para Term Preterm AB Living  1 0 0 0 0 0  SAB IAB Ectopic Multiple Live Births  0 0 0 0 0    # Outcome Date GA Lbr Len/2nd Weight Sex Delivery Anes PTL Lv  1 Current             Gynecologic History:  Last pap smear was 12/06/2021.  Results were Normal.  Denies h/o abnormal pap smears in the past.  Reports history of STIs: Trichomonas on 06/26/2922.  Contraception prior to conception: None   Past Medical History:  Diagnosis Date   Anxiety    Fatigue    History of COVID-19 11/24/2020   Diagnosed January 2022   Nevus    Vaccine for human papilloma virus (HPV) types 6, 157 163 and 18 administered     Family History  Problem Relation Age of Onset   Anemia Mother    Hypothyroidism Paternal Grandmother    Heart attack Paternal Grandmother    Brain cancer Other        malignant, great mat uncle-60   Cancer Other 60       colon cancer    Past Surgical History:  Procedure Laterality Date   ARM SKIN LESION BIOPSY / EXCISION Left 2012   LYMPH NODE DISSECTION Right 2011   MOLE REMOVAL Left    moval removal off of back    Social History   Socioeconomic History   Marital status: Single    Spouse name: JLarkin Ina  Number of children: 0   Years of education: Not on file   Highest education level: Bachelor's degree (e.g., BA, AB, BS)  Occupational History   Not on file  Tobacco Use   Smoking status: Never   Smokeless tobacco:  Never  Vaping Use   Vaping Use: Never used  Substance and Sexual Activity   Alcohol use: Not Currently    Comment: occasinally    Drug use: No   Sexual activity: Yes    Partners: Male  Other Topics Concern   Not on file  Social History Narrative   Not on file   Social Determinants of Health   Financial Resource Strain: Low Risk  (09/03/2022)   Overall Financial Resource Strain (CARDIA)    Difficulty of Paying Living Expenses: Not very hard  Food Insecurity: No Food Insecurity (09/03/2022)   Hunger Vital Sign    Worried About Running Out of Food in the Last Year: Never true    Ran Out of Food in the Last Year: Never true  Transportation Needs: No Transportation Needs (09/03/2022)   PRAPARE - THydrologist(Medical): No    Lack of Transportation (Non-Medical): No  Physical Activity: Insufficiently Active (09/03/2022)   Exercise Vital Sign    Days of Exercise per Week: 1 day    Minutes of Exercise per Session: 40 min  Stress: No Stress Concern Present (09/03/2022)   [place]  Stress Questionnaire    Feeling of Stress : Not at all  Social Connections: Moderately Integrated (09/03/2022)   Social Connection and Isolation Panel [NHANES]    Frequency of Communication with Friends and Family: More than three times a week    Frequency of Social Gatherings with Friends and Family: More than three times a week    Attends Religious Services: 1 to 4 times per year    Active Member of [employer] or Organizations: No    Attends Archivist Meetings: Never    Marital Status: Living with partner  Intimate Partner Violence: Not At Risk (09/03/2022)   Humiliation, Afraid, Rape, and Kick questionnaire    Fear of Current or Ex-Partner: No    Emotionally Abused: No    Physically Abused: No    Sexually Abused: No    Current Outpatient Medications on File Prior to Visit  Medication Sig Dispense Refill   albuterol  (VENTOLIN HFA) 108 (90 Base) MCG/ACT inhaler As needed     No current facility-administered medications on file prior to visit.    Allergies  Allergen Reactions   Mushroom Extract Complex Hives and Swelling   Prednisone Palpitations     Review of Systems General: Not Present- Fever, Weight Loss and Weight Gain. Skin: Not Present- Rash. HEENT: Not Present- Blurred Vision, Headache and Bleeding Gums. Respiratory: Not Present- Difficulty Breathing. Breast: Not Present- Breast Mass. Cardiovascular: Not Present- Chest Pain, Elevated Blood Pressure, Fainting / Blacking Out and Shortness of Breath. Gastrointestinal: Not Present- Abdominal Pain, Constipation, Vomiting. Positive for nausea and vomiting, has tried pregnancy suckers and pregnancy wrist bands with minimal relief).  Female Genitourinary: Not Present- Frequency, Painful Urination, Pelvic Pain, Vaginal Bleeding, Contractions, regular, Fetal Movements Decreased, Urinary Complaints and Vaginal Fluid.  Present- Vaginal Discharge (Has been using OTC Monistat for external vulvar itching, did see green tint to discharge once).  Musculoskeletal: Not Present- Back Pain and Leg Cramps. Neurological: Not Present- Dizziness. Psychiatric: Not Present- Depression.     Objective:    Blood pressure 115/75, pulse 85, weight (!) 301 lb 12.8 oz (136.9 kg), last menstrual period 07/08/2022.  Body mass index is 45.89 kg/m.  General Appearance:    Alert, cooperative, no distress, appears stated age, morbid obesity.   Head:    Normocephalic, without obvious abnormality, atraumatic  Eyes:    PERRL, conjunctiva/corneas clear, EOM's intact, both eyes  Ears:    Normal external ear canals, both ears  Nose:   Nares normal, septum midline, mucosa normal, no drainage or sinus tenderness  Throat:   Lips, mucosa, and tongue normal; teeth and gums normal  Neck:   Supple, symmetrical, trachea midline, no adenopathy; thyroid: no enlargement/tenderness/nodules;  no carotid bruit or JVD  Back:     Symmetric, no curvature, ROM normal, no CVA tenderness  Lungs:     Clear to auscultation bilaterally, respirations unlabored  Chest Wall:    No tenderness or deformity   Heart:    Regular rate and rhythm, S1 and S2 normal, no murmur, rub or gallop  Breast Exam:    No tenderness, masses, or nipple abnormality  Abdomen:     Soft, non-tender, bowel sounds active all four quadrants, no masses, no organomegaly.  FHT 164 bpm.  Genitalia:    Pelvic:external genitalia normal, pelvic exam deferred as patient had pelvic exam earlier this year for annual.  Blind swab collected for complaint of vaginal discharge.   Rectal:    Normal external sphincter.  No hemorrhoids appreciated. Internal  exam not done.   Extremities:   Extremities normal, atraumatic, no cyanosis or edema  Pulses:   2+ and symmetric all extremities  Skin:   Skin color, texture, turgor normal, no rashes or lesions  Lymph nodes:   Cervical, supraclavicular, and axillary nodes normal  Neurologic:   CNII-XII intact, normal strength, sensation and reflexes throughout     Assessment:   1. Encounter for supervision of normal first pregnancy in first trimester   2. [redacted] weeks gestation of pregnancy   3. Genetic screening   4. Vaginal discharge   5. Obesity in pregnancy   6. Vomiting or nausea of pregnancy     Plan:   Supervision of high risk pregnancy at [redacted] weeks gestation - Initial labs reviewed. - Prenatal vitamins encouraged. - Problem list reviewed and updated. - New OB counseling:  The patient has been given an overview regarding routine prenatal care.  Recommendations regarding diet, weight gain, and exercise in pregnancy were given. - Prenatal testing, optional genetic testing, and ultrasound use in pregnancy were reviewed.  Traditional genetic screening vs cell-fee DNA genetic screening discussed, including risks and benefits. Testing requested, Panorama ordered. - Benefits of Breast Feeding  were discussed. The patient is encouraged to consider nursing her baby post partum.  2. Genetic screening - Panorama Prenatal Test Full Panel  3. Vaginal discharge - Cervicovaginal ancillary only - Currently using Monistat for external symptoms.    4.  Obesity in pregnancy - To begin daily aspirin 81 mg  - For serial growth scans in 3rd trimester and to begin antenatal testing at 32-34 weeks.  - Consider IOL by 40 weeks.   5. Vomiting or nausea of pregnancy - Discussed other OTC options for management of mild nausea/vomiting including Vitamin B6 and Unisom.  Discussed option for prescription medications if no relief or symptoms worsen.   Follow up in 4 weeks.    Rubie Maid, MD [facility] at [facility]

## 2022-09-26 NOTE — Progress Notes (Signed)
ROB [redacted]w[redacted]d She is doing well. No new concerns today.

## 2022-09-26 NOTE — Patient Instructions (Signed)

## 2022-09-27 LAB — CBC
Hematocrit: 40 % (ref 34.0–46.6)
Hemoglobin: 12.9 g/dL (ref 11.1–15.9)
MCH: 29.8 pg (ref 26.6–33.0)
MCHC: 32.3 g/dL (ref 31.5–35.7)
MCV: 92 fL (ref 79–97)
Platelets: 326 10*3/uL (ref 150–450)
RBC: 4.33 x10E6/uL (ref 3.77–5.28)
RDW: 11.8 % (ref 11.7–15.4)
WBC: 8.5 10*3/uL (ref 3.4–10.8)

## 2022-09-27 LAB — CERVICOVAGINAL ANCILLARY ONLY
Bacterial Vaginitis (gardnerella): NEGATIVE
Candida Glabrata: NEGATIVE
Candida Vaginitis: NEGATIVE
Comment: NEGATIVE
Comment: NEGATIVE
Comment: NEGATIVE

## 2022-10-02 ENCOUNTER — Encounter: Payer: Self-pay | Admitting: Obstetrics and Gynecology

## 2022-10-03 ENCOUNTER — Other Ambulatory Visit: Payer: Self-pay | Admitting: Obstetrics

## 2022-10-03 ENCOUNTER — Telehealth: Payer: Self-pay

## 2022-10-03 MED ORDER — DOXYLAMINE-PYRIDOXINE 10-10 MG PO TBEC: 2.0000 | DELAYED_RELEASE_TABLET | Freq: Every day | ORAL | 2 refills | Status: DC

## 2022-10-03 NOTE — Telephone Encounter (Signed)
Pt also sent a mychart msg; please refer to it.

## 2022-10-03 NOTE — Telephone Encounter (Signed)
Pt calling; has had really bad morning sickness the last few days; feels like she is getting dehydrated; at what point does she need to get IV fluids?  [phone]  Mailbox is full.

## 2022-10-10 ENCOUNTER — Encounter: Payer: Self-pay | Admitting: Obstetrics and Gynecology

## 2022-10-12 ENCOUNTER — Telehealth: Payer: Self-pay

## 2022-10-12 NOTE — Telephone Encounter (Signed)
Patient is returning missed call. Please advise 

## 2022-10-12 NOTE — Telephone Encounter (Signed)
Called patient she will come in and pick up gender results and genetics.

## 2022-10-12 NOTE — Telephone Encounter (Signed)
Called patient to notify her of her results of genetic screening. No answer. LVM for her to call back.

## 2022-10-24 ENCOUNTER — Ambulatory Visit (INDEPENDENT_AMBULATORY_CARE_PROVIDER_SITE_OTHER): Payer: 59 | Admitting: Obstetrics and Gynecology

## 2022-10-24 ENCOUNTER — Encounter: Payer: Self-pay | Admitting: Obstetrics and Gynecology

## 2022-10-24 VITALS — BP 134/89 | HR 98 | Wt 300.2 lb

## 2022-10-24 DIAGNOSIS — O99210 Obesity complicating pregnancy, unspecified trimester: Secondary | ICD-10-CM

## 2022-10-24 DIAGNOSIS — Z6841 Body Mass Index (BMI) 40.0 and over, adult: Secondary | ICD-10-CM | POA: Insufficient documentation

## 2022-10-24 DIAGNOSIS — Z3A14 14 weeks gestation of pregnancy: Secondary | ICD-10-CM

## 2022-10-24 DIAGNOSIS — Z3401 Encounter for supervision of normal first pregnancy, first trimester: Secondary | ICD-10-CM

## 2022-10-24 DIAGNOSIS — J45909 Unspecified asthma, uncomplicated: Secondary | ICD-10-CM

## 2022-10-24 DIAGNOSIS — O99212 Obesity complicating pregnancy, second trimester: Secondary | ICD-10-CM

## 2022-10-24 DIAGNOSIS — E669 Obesity, unspecified: Secondary | ICD-10-CM

## 2022-10-24 HISTORY — DX: Unspecified asthma, uncomplicated: J45.909

## 2022-10-24 LAB — POCT URINALYSIS DIPSTICK OB
Bilirubin, UA: NEGATIVE
Glucose, UA: NEGATIVE
Ketones, UA: NEGATIVE
Nitrite, UA: NEGATIVE
Spec Grav, UA: 1.015 (ref 1.010–1.025)
Urobilinogen, UA: 0.2 E.U./dL
pH, UA: 7 (ref 5.0–8.0)

## 2022-10-24 MED ORDER — ASPIRIN 81 MG PO TBEC: 81.0000 mg | DELAYED_RELEASE_TABLET | Freq: Every day | ORAL | 2 refills | Status: DC

## 2022-10-24 MED ORDER — DOXYLAMINE-PYRIDOXINE 10-10 MG PO TBEC: 2.0000 | DELAYED_RELEASE_TABLET | Freq: Every day | ORAL | 2 refills | Status: DC

## 2022-10-24 NOTE — Progress Notes (Signed)
ROB: Patient is a 32 y.o. G1P0000 at [redacted]w[redacted]d  Notes nausea has improved some, Diclegis is helping, needs refill. Recommend starting daily baby aspirin. Discussed second trimester expectations. Undecided about flu vaccine, given info. RTC in 4 weeks, for anatomy scan in 6 weeks.

## 2022-10-24 NOTE — Progress Notes (Signed)
ROB [redacted]w[redacted]d She is doing well. Feels only flutters. She has no new concerns.

## 2022-10-25 ENCOUNTER — Encounter: Payer: Self-pay | Admitting: Obstetrics and Gynecology

## 2022-10-31 ENCOUNTER — Encounter: Payer: Self-pay | Admitting: Obstetrics and Gynecology

## 2022-11-09 ENCOUNTER — Encounter: Payer: Self-pay | Admitting: Obstetrics and Gynecology

## 2022-11-14 MED ORDER — DOXYLAMINE-PYRIDOXINE 10-10 MG PO TBEC: 2.0000 | DELAYED_RELEASE_TABLET | Freq: Every day | ORAL | 1 refills | Status: DC

## 2022-11-21 ENCOUNTER — Ambulatory Visit (INDEPENDENT_AMBULATORY_CARE_PROVIDER_SITE_OTHER): Payer: 59 | Admitting: Licensed Practical Nurse

## 2022-11-21 ENCOUNTER — Encounter: Payer: Self-pay | Admitting: Licensed Practical Nurse

## 2022-11-21 VITALS — BP 130/87 | HR 101 | Wt 301.3 lb

## 2022-11-21 DIAGNOSIS — Z6841 Body Mass Index (BMI) 40.0 and over, adult: Secondary | ICD-10-CM | POA: Diagnosis not present

## 2022-11-21 DIAGNOSIS — Z131 Encounter for screening for diabetes mellitus: Secondary | ICD-10-CM

## 2022-11-21 DIAGNOSIS — Z3401 Encounter for supervision of normal first pregnancy, first trimester: Secondary | ICD-10-CM | POA: Diagnosis not present

## 2022-11-21 DIAGNOSIS — Z3A18 18 weeks gestation of pregnancy: Secondary | ICD-10-CM

## 2022-11-21 DIAGNOSIS — Z3402 Encounter for supervision of normal first pregnancy, second trimester: Secondary | ICD-10-CM

## 2022-11-21 LAB — POCT URINALYSIS DIPSTICK
Bilirubin, UA: NEGATIVE
Blood, UA: NEGATIVE
Glucose, UA: NEGATIVE
Ketones, UA: NEGATIVE
Nitrite, UA: NEGATIVE
Protein, UA: POSITIVE — AB
Spec Grav, UA: 1.02 (ref 1.010–1.025)
Urobilinogen, UA: 1 E.U./dL
pH, UA: 7 (ref 5.0–8.0)

## 2022-11-21 NOTE — Progress Notes (Signed)
Routine Prenatal Care Visit  Subjective  Melanie Frederick is a 32 y.o. G1P0000 at 36w2dbeing seen today for ongoing prenatal care.  She is currently monitored for the following issues for this high-risk pregnancy and has History of COVID-19; Obesity, Class III, BMI 40-49.9 (morbid obesity) ([place]; First pregnancy; and Asthma on their problem list.  ----------------------------------------------------------------------------------- Patient reports  round ligament pain . 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  She had an at home sleep study once, the results were inconclusive. Would like another sleep study -[place]-precautions given -BMI 45: reviewed POC, has started ASA, doing 1 hour glucola today. Reviewed need for anesthesia consult,  third trimester testing and potential need for IOL around 40 wks.  -reviewed looking for CBE  Contractions: Not present. Vag. Bleeding: None.  Movement: Present. Leaking Fluid denies.  ----------------------------------------------------------------------------------- The following portions of the patient's history were reviewed and updated as appropriate: allergies, current medications, past family history, past medical history, past social history, past surgical history and problem list. Problem list updated.  Objective  Blood pressure 130/87, pulse (!) 101, weight (!) 301 lb 4.8 oz (136.7 kg), last menstrual period 07/08/2022. Pregravid weight 290 lb (131.5 kg) Total Weight Gain 11 lb 4.8 oz (5.126 kg) Urinalysis: Urine Protein    Urine Glucose    Fetal Status: Fetal Heart Rate (bpm): 150   Movement: Present     General:  Alert, oriented and cooperative. Patient is in no acute distress.  Skin: Skin is warm and dry. No rash noted.   Cardiovascular: Normal heart rate noted  Respiratory:  Normal respiratory effort, no problems with respiration noted  Abdomen: Soft, gravid, appropriate for gestational age. Pain/Pressure: Present     Pelvic:  Cervical exam deferred        Extremities: Normal range of motion.  Edema: None  Mental Status: Normal mood and affect. Normal behavior. Normal judgment and thought content.   Assessment   32y.o. G1P0000 at 162w2dy  04/22/2023, by Ultrasound presenting for routine prenatal visit  Plan   G1 Problems (from 09/26/22 to present)     No problems associated with this episode.         general obstetric precautions including but not limited to vaginal bleeding, contractions, leaking of fluid and fetal movement were reviewed in detail with the patient. Please refer to After Visit Summary for other counseling recommendations.   Return in about 4 weeks (around 12/19/2022) for ROB.  Early one hour collected   Anatomy [place]/28   LyRoberto ScalesCNHolsteinroup  11/21/22  10:15 AM

## 2022-11-22 ENCOUNTER — Other Ambulatory Visit: Payer: Self-pay | Admitting: Licensed Practical Nurse

## 2022-11-22 DIAGNOSIS — R7309 Other abnormal glucose: Secondary | ICD-10-CM

## 2022-11-22 DIAGNOSIS — O0990 Supervision of high risk pregnancy, unspecified, unspecified trimester: Secondary | ICD-10-CM

## 2022-11-22 LAB — GLUCOSE TOLERANCE, 1 HOUR: Glucose, 1Hr PP: 156 mg/dL (ref 70–199)

## 2022-11-22 NOTE — Progress Notes (Signed)
Pt with abnormal early 1 hour, needs 3 hour glucose test.  Pt messaged through mychart Orders placed Roberto Scales, [facility]  11/22/22  6:01 PM

## 2022-11-25 LAB — URINE CULTURE

## 2022-11-26 ENCOUNTER — Other Ambulatory Visit: Payer: 59

## 2022-11-26 DIAGNOSIS — O0990 Supervision of high risk pregnancy, unspecified, unspecified trimester: Secondary | ICD-10-CM

## 2022-11-26 DIAGNOSIS — R7309 Other abnormal glucose: Secondary | ICD-10-CM | POA: Diagnosis not present

## 2022-11-27 LAB — GESTATIONAL GLUCOSE TOLERANCE
Glucose, Fasting: 82 mg/dL (ref 70–94)
Glucose, GTT - 1 Hour: 164 mg/dL (ref 70–179)
Glucose, GTT - 2 Hour: 111 mg/dL (ref 70–154)
Glucose, GTT - 3 Hour: 56 mg/dL — ABNORMAL LOW (ref 70–139)

## 2022-12-03 ENCOUNTER — Encounter: Payer: Self-pay | Admitting: Physician Assistant

## 2022-12-03 ENCOUNTER — Encounter: Payer: Self-pay | Admitting: Obstetrics and Gynecology

## 2022-12-03 ENCOUNTER — Ambulatory Visit: Payer: Self-pay | Admitting: Physician Assistant

## 2022-12-03 VITALS — Temp 98.6°F

## 2022-12-03 DIAGNOSIS — H5789 Other specified disorders of eye and adnexa: Secondary | ICD-10-CM

## 2022-12-03 MED ORDER — GENTAMICIN SULFATE 0.3 % OP SOLN: 1.0000 [drp] | OPHTHALMIC | 0 refills | Status: DC

## 2022-12-03 NOTE — Progress Notes (Signed)
   Subjective: Right eye irritation    Patient ID: Melanie Frederick, female    DOB: Aug 17, 1991, 32 y.o.   MRN: [id number]  HPI Patient complain of right eye irritation secondary to being exposed to conjunctivitis over the weekend.  Patient denies vision loss.  Patient denies matted eyelids or discharge.  Describes her complaint as a "scratchy "sensation.   Review of Systems Negative septa chief complaint    Objective:   Physical Exam  See nurses notes for vital signs.  Visual acuity not obtained. Eye exam is unremarkable.     Assessment & Plan: Right eye irritation versus conjunctivitis.   Due to patient's history of exposure will prophylactically treat with gentamicin eyedrops.  Patient given work restriction for 24 hours.  Follow-up if no improvement or worsening complaint.

## 2022-12-03 NOTE — Progress Notes (Signed)
Pt states she was exposed to pink eye over the weekned to her 6 year sister and she states she has it pretty bad. Pt woke up this morning with her right eye feeling scratchy and achy mostly at bottom of eye lid.

## 2022-12-05 ENCOUNTER — Other Ambulatory Visit: Payer: 59

## 2022-12-05 ENCOUNTER — Ambulatory Visit
Admission: RE | Admit: 2022-12-05 | Discharge: 2022-12-05 | Disposition: A | Discharge status: Home / Self Care | Payer: 59 | Source: Ambulatory Visit | Attending: Obstetrics and Gynecology | Admitting: Obstetrics and Gynecology

## 2022-12-05 DIAGNOSIS — Z3401 Encounter for supervision of normal first pregnancy, first trimester: Secondary | ICD-10-CM

## 2022-12-05 DIAGNOSIS — Z369 Encounter for antenatal screening, unspecified: Secondary | ICD-10-CM | POA: Diagnosis not present

## 2022-12-05 DIAGNOSIS — O4102X0 Oligohydramnios, second trimester, not applicable or unspecified: Secondary | ICD-10-CM | POA: Diagnosis not present

## 2022-12-05 DIAGNOSIS — Z3689 Encounter for other specified antenatal screening: Secondary | ICD-10-CM | POA: Diagnosis not present

## 2022-12-05 DIAGNOSIS — Z3A20 20 weeks gestation of pregnancy: Secondary | ICD-10-CM | POA: Diagnosis not present

## 2022-12-06 ENCOUNTER — Encounter: Payer: Self-pay | Admitting: Licensed Practical Nurse

## 2022-12-06 ENCOUNTER — Telehealth: Payer: Self-pay

## 2022-12-06 NOTE — Telephone Encounter (Signed)
Pt reports pain on her right lower side started last night pain is a level 3. She's states she is drinking water or trying to. Pt advised this could be round ligament pain , monitor and call us if pain worsen.

## 2022-12-08 ENCOUNTER — Other Ambulatory Visit: Payer: Self-pay | Admitting: Obstetrics and Gynecology

## 2022-12-08 DIAGNOSIS — Z362 Encounter for other antenatal screening follow-up: Secondary | ICD-10-CM

## 2022-12-08 DIAGNOSIS — O288 Other abnormal findings on antenatal screening of mother: Secondary | ICD-10-CM

## 2022-12-10 ENCOUNTER — Encounter: Payer: Self-pay | Admitting: Obstetrics and Gynecology

## 2022-12-10 ENCOUNTER — Encounter
Admission: RE | Admit: 2022-12-10 | Discharge: 2022-12-10 | Disposition: A | Discharge status: Home / Self Care | Payer: 59 | Source: Ambulatory Visit | Attending: Anesthesiology | Admitting: Anesthesiology

## 2022-12-10 NOTE — Consult Note (Signed)
[facility] Anesthesia Consultation  Melanie Frederick [id number] DOB: 1990-11-01 DOA: 12/10/2022 PCP: Patient, No Pcp Per   Requesting physician: Rubie Maid Date of consultation: 12/09/2021 Reason for consultation: Obesity during pregnancy  CHIEF COMPLAINT:  Obesity during pregnancy  HISTORY OF PRESENT ILLNESS: Melanie Frederick  is a 32 y.o. female with a known history of morbid obesity.  PAST MEDICAL HISTORY:   Past Medical History:  Diagnosis Date   Anxiety    Asthma 10/24/2022   Fatigue    History of COVID-19 11/24/2020   Diagnosed January 2022   Nevus    Vaccine for human papilloma virus (HPV) types 6, 11, 16, and 18 administered     PAST SURGICAL HISTORY:  Past Surgical History:  Procedure Laterality Date   ARM SKIN LESION BIOPSY / EXCISION Left 2012   LYMPH NODE DISSECTION Right 2011   MOLE REMOVAL Left    moval removal off of back    SOCIAL HISTORY:  Social History   Tobacco Use   Smoking status: Never   Smokeless tobacco: Never  Substance Use Topics   Alcohol use: Not Currently    Comment: occasinally     FAMILY HISTORY:  Family History  Problem Relation Age of Onset   Anemia Mother    Hypothyroidism Paternal Grandmother    Heart attack Paternal Grandmother    Brain cancer Other        malignant, great mat uncle-60   Cancer Other 60       colon cancer    DRUG ALLERGIES:  Allergies  Allergen Reactions   Mushroom Extract Complex Hives and Swelling   Prednisone Palpitations    REVIEW OF SYSTEMS:   RESPIRATORY: No cough, shortness of breath, wheezing.  CARDIOVASCULAR: No chest pain, orthopnea, edema.  HEMATOLOGY: No anemia, easy bruising or bleeding SKIN: No rash or lesion. NEUROLOGIC: No tingling, numbness, weakness.  PSYCHIATRY: No anxiety or depression.   MEDICATIONS AT HOME:  Prior to Admission medications   Medication Sig Start Date End Date Taking? Authorizing Provider  aspirin EC 81 MG  tablet Take 1 tablet (81 mg total) by mouth daily. Take after 12 weeks for prevention of preeclampssia later in pregnancy 10/24/22   Rubie Maid, MD  Doxylamine-Pyridoxine (DICLEGIS) 10-10 MG TBEC Take 2 tablets by mouth at bedtime. If symptoms persist, add one tablet in the morning and one in the afternoon 11/14/22   Rubie Maid, MD  gentamicin (GARAMYCIN) 0.3 % ophthalmic solution Place 1 drop into both eyes every 4 (four) hours. 12/03/22   Sable Feil, PA-C  Prenatal Vit-Fe Fumarate-FA (MULTIVITAMIN-PRENATAL) 27-0.8 MG TABS tablet Take 1 tablet by mouth daily at 12 noon.    [provider]      PHYSICAL EXAMINATION:   VITAL SIGNS: Height '5\' 7"'$  (1.702 m), weight (!) 138.2 kg, last menstrual period 07/08/2022.  GENERAL:  32 y.o.-year-old patient no acute distress.  HEENT: Head atraumatic, normocephalic. Oropharynx and nasopharynx clear. MP 2, TM distance >3 cm, normal mouth opening. LUNGS: No use of accessory muscles of respiration.   EXTREMITIES: No pedal edema, cyanosis, or clubbing.  NEUROLOGIC: normal gait PSYCHIATRIC: The patient is alert and oriented x 3.  SKIN: No obvious rash, lesion, or ulcer.    IMPRESSION AND PLAN:   Melanie Frederick  is a 32 y.o. female presenting with obesity during pregnancy. BMI is currently 47.41 at [redacted] weeks gestation.   We discussed analgesic options during labor including epidural analgesia. Discussed that in obesity there can be  increased difficulty with epidural placement or even failure of successful epidural. We also discussed that even after successful epidural placement there is increased risk of catheter migration out of the epidural space that would require catheter replacement. Discussed use of epidural vs spinal vs GA if cesarean delivery is required. Discussed increased risk of difficult intubation during pregnancy should an emergency cesarean delivery be required.   This patient is appropriate for anesthetic care at [facility] in  [place].  We discussed the limitations of a community hospital including but not limited to staffing, imaging, medications, and blood products.  The patient is aware of these limitations.  All questions answered and concerns addressed.   Patient will need to return for follow-up visit around 32-34wks as BMI is anticipated to increase over the next few weeks.

## 2022-12-10 NOTE — Addendum Note (Signed)
Addended by: Meryl Dare on: 12/10/2022 04:37 PM   Modules accepted: Orders

## 2022-12-12 ENCOUNTER — Other Ambulatory Visit: Payer: Self-pay | Admitting: Licensed Practical Nurse

## 2022-12-12 DIAGNOSIS — R0683 Snoring: Secondary | ICD-10-CM

## 2022-12-19 ENCOUNTER — Ambulatory Visit (INDEPENDENT_AMBULATORY_CARE_PROVIDER_SITE_OTHER): Payer: 59 | Admitting: Obstetrics & Gynecology

## 2022-12-19 ENCOUNTER — Encounter: Payer: Self-pay | Admitting: Obstetrics & Gynecology

## 2022-12-19 VITALS — BP 128/65 | HR 89 | Wt 306.0 lb

## 2022-12-19 DIAGNOSIS — O99212 Obesity complicating pregnancy, second trimester: Secondary | ICD-10-CM

## 2022-12-19 DIAGNOSIS — Z6841 Body Mass Index (BMI) 40.0 and over, adult: Secondary | ICD-10-CM

## 2022-12-19 DIAGNOSIS — O0990 Supervision of high risk pregnancy, unspecified, unspecified trimester: Secondary | ICD-10-CM

## 2022-12-19 DIAGNOSIS — E669 Obesity, unspecified: Secondary | ICD-10-CM

## 2022-12-19 DIAGNOSIS — Z3A22 22 weeks gestation of pregnancy: Secondary | ICD-10-CM

## 2022-12-19 DIAGNOSIS — Z3402 Encounter for supervision of normal first pregnancy, second trimester: Secondary | ICD-10-CM

## 2022-12-19 LAB — POCT URINALYSIS DIPSTICK OB
Bilirubin, UA: NEGATIVE
Blood, UA: NEGATIVE
Glucose, UA: NEGATIVE
Ketones, UA: NEGATIVE
Leukocytes, UA: NEGATIVE
Nitrite, UA: NEGATIVE
POC,PROTEIN,UA: NEGATIVE
Spec Grav, UA: 1.01 (ref 1.010–1.025)
Urobilinogen, UA: 0.2 E.U./dL
pH, UA: 6 (ref 5.0–8.0)

## 2022-12-19 NOTE — Progress Notes (Signed)
   PRENATAL VISIT NOTE  Subjective:  Melanie Frederick is a 32 y.o. G1P0000 at [redacted]w[redacted]d being seen today for ongoing prenatal care.  She is currently monitored for the following issues for this high-risk pregnancy and has History of COVID-19; BMI 45.0-49.9, adult ([place]); First pregnancy; and Asthma on their problem list.  Patient reports no complaints.  Contractions: Not present. Vag. Bleeding: None.  Movement: Present. Denies leaking of fluid.   The following portions of the patient's history were reviewed and updated as appropriate: allergies, current medications, past family history, past medical history, past social history, past surgical history and problem list.   Objective:   Vitals:   12/19/22 0940  BP: 128/65  Pulse: 89  Weight: (!) 306 lb (138.8 kg)    Fetal Status:     Movement: Present     General:  Alert, oriented and cooperative. Patient is in no acute distress.  Skin: Skin is warm and dry. No rash noted.   Cardiovascular: Normal heart rate noted  Respiratory: Normal respiratory effort, no problems with respiration noted  Abdomen: Soft, gravid, appropriate for gestational age.  Pain/Pressure: Absent     Pelvic: Cervical exam deferred        Extremities: Normal range of motion.     Mental Status: Normal mood and affect. Normal behavior. Normal judgment and thought content.   Assessment and Plan:  Pregnancy: G1P0000 at [redacted]w[redacted]d 1. [redacted] weeks gestation of pregnancy - glucola next visit  2. Supervision of high risk pregnancy, antepartum  - POC Urinalysis Dipstick OB  3. BMI 45.0-49.9, adult ([place]) - on baby asa daily - MFM scan next week to follow up on "decreased" fluid on anatomy scan - she has had anesthesia consult and will get another around [redacted] weeks EGA   Preterm labor symptoms and general obstetric precautions including but not limited to vaginal bleeding, contractions, leaking of fluid and fetal movement were reviewed in detail with the patient. Please refer to  After Visit Summary for other counseling recommendations.   Return in about 4 years (around 12/19/2026) for 28 weeks labs.  Future Appointments  Date Time Provider [place]  12/24/2022  1:00 PM [facility]    Emily Filbert, MD

## 2022-12-22 NOTE — Addendum Note (Signed)
Addended by: Roberto Scales on: 12/22/2022 11:03 AM   Modules accepted: Orders

## 2022-12-24 ENCOUNTER — Other Ambulatory Visit: Payer: Self-pay

## 2022-12-24 ENCOUNTER — Ambulatory Visit: Payer: 59 | Attending: Obstetrics and Gynecology

## 2022-12-24 DIAGNOSIS — O288 Other abnormal findings on antenatal screening of mother: Secondary | ICD-10-CM | POA: Diagnosis not present

## 2022-12-24 DIAGNOSIS — O99212 Obesity complicating pregnancy, second trimester: Secondary | ICD-10-CM | POA: Insufficient documentation

## 2022-12-24 DIAGNOSIS — E669 Obesity, unspecified: Secondary | ICD-10-CM | POA: Insufficient documentation

## 2022-12-24 DIAGNOSIS — Z362 Encounter for other antenatal screening follow-up: Secondary | ICD-10-CM

## 2022-12-24 DIAGNOSIS — O35HXX0 Maternal care for other (suspected) fetal abnormality and damage, fetal lower extremities anomalies, not applicable or unspecified: Secondary | ICD-10-CM | POA: Diagnosis not present

## 2022-12-24 DIAGNOSIS — Z3A23 23 weeks gestation of pregnancy: Secondary | ICD-10-CM | POA: Insufficient documentation

## 2022-12-24 DIAGNOSIS — O321XX0 Maternal care for breech presentation, not applicable or unspecified: Secondary | ICD-10-CM | POA: Diagnosis not present

## 2022-12-25 ENCOUNTER — Encounter: Payer: Self-pay | Admitting: Obstetrics and Gynecology

## 2022-12-27 ENCOUNTER — Encounter: Payer: Self-pay | Admitting: Obstetrics and Gynecology

## 2023-01-04 ENCOUNTER — Encounter: Payer: Self-pay | Admitting: Obstetrics and Gynecology

## 2023-01-04 ENCOUNTER — Other Ambulatory Visit: Payer: Self-pay | Admitting: Obstetrics and Gynecology

## 2023-01-07 MED ORDER — DOXYLAMINE-PYRIDOXINE 10-10 MG PO TBEC: 2.0000 | DELAYED_RELEASE_TABLET | Freq: Every day | ORAL | 1 refills | Status: DC

## 2023-01-08 ENCOUNTER — Ambulatory Visit: Payer: 59 | Attending: Otolaryngology

## 2023-01-08 DIAGNOSIS — R0683 Snoring: Secondary | ICD-10-CM | POA: Diagnosis not present

## 2023-01-08 DIAGNOSIS — G4761 Periodic limb movement disorder: Secondary | ICD-10-CM | POA: Insufficient documentation

## 2023-01-14 ENCOUNTER — Other Ambulatory Visit: Payer: Self-pay

## 2023-01-14 DIAGNOSIS — O99213 Obesity complicating pregnancy, third trimester: Secondary | ICD-10-CM

## 2023-01-14 DIAGNOSIS — J45909 Unspecified asthma, uncomplicated: Secondary | ICD-10-CM

## 2023-01-16 ENCOUNTER — Other Ambulatory Visit: Payer: 59

## 2023-01-16 ENCOUNTER — Encounter: Payer: Self-pay | Admitting: Obstetrics and Gynecology

## 2023-01-16 ENCOUNTER — Ambulatory Visit (INDEPENDENT_AMBULATORY_CARE_PROVIDER_SITE_OTHER): Payer: 59 | Admitting: Obstetrics and Gynecology

## 2023-01-16 VITALS — BP 113/75 | HR 94 | Wt 318.2 lb

## 2023-01-16 DIAGNOSIS — Z3A26 26 weeks gestation of pregnancy: Secondary | ICD-10-CM

## 2023-01-16 DIAGNOSIS — Z3482 Encounter for supervision of other normal pregnancy, second trimester: Secondary | ICD-10-CM

## 2023-01-16 DIAGNOSIS — O0990 Supervision of high risk pregnancy, unspecified, unspecified trimester: Secondary | ICD-10-CM

## 2023-01-16 DIAGNOSIS — Z131 Encounter for screening for diabetes mellitus: Secondary | ICD-10-CM | POA: Diagnosis not present

## 2023-01-16 DIAGNOSIS — Z23 Encounter for immunization: Secondary | ICD-10-CM | POA: Diagnosis not present

## 2023-01-16 DIAGNOSIS — Z113 Encounter for screening for infections with a predominantly sexual mode of transmission: Secondary | ICD-10-CM

## 2023-01-16 DIAGNOSIS — Z3400 Encounter for supervision of normal first pregnancy, unspecified trimester: Secondary | ICD-10-CM

## 2023-01-16 DIAGNOSIS — Z130 Encounter for screening for diseases of the blood and blood-forming organs and certain disorders involving the immune mechanism: Secondary | ICD-10-CM

## 2023-01-16 LAB — POCT URINALYSIS DIPSTICK OB
Bilirubin, UA: NEGATIVE
Blood, UA: NEGATIVE
Glucose, UA: NEGATIVE
Ketones, UA: NEGATIVE
Leukocytes, UA: NEGATIVE
Nitrite, UA: NEGATIVE
Spec Grav, UA: 1.025 (ref 1.010–1.025)
Urobilinogen, UA: 0.2 E.U./dL
pH, UA: 7.5 (ref 5.0–8.0)

## 2023-01-16 NOTE — Progress Notes (Signed)
ROB. Patient states daily fetal movement along with hip pains in the morning. She reports bilateral foot and ankle swelling. Patient completed GCT, received TDAP injection and signed BTC today. Patient states no questions or concerns at this time.

## 2023-01-16 NOTE — Progress Notes (Signed)
ROB: 1 hour GCT today.  Complains of intermittent foot and ankle swelling.  None today.  Follow-up ultrasound for fetal femur next week.  Follow-up anesthesia appointment for increased BMI at 32 to 34 weeks.

## 2023-01-17 ENCOUNTER — Telehealth (INDEPENDENT_AMBULATORY_CARE_PROVIDER_SITE_OTHER): Payer: 59 | Admitting: Pulmonary Disease

## 2023-01-17 ENCOUNTER — Encounter: Payer: Self-pay | Admitting: Obstetrics and Gynecology

## 2023-01-17 DIAGNOSIS — R0683 Snoring: Secondary | ICD-10-CM | POA: Diagnosis not present

## 2023-01-17 LAB — 28 WEEK RH+PANEL
Basophils Absolute: 0 10*3/uL (ref 0.0–0.2)
Basos: 0 %
EOS (ABSOLUTE): 0.1 10*3/uL (ref 0.0–0.4)
Eos: 2 %
Gestational Diabetes Screen: 124 mg/dL (ref 70–139)
HIV Screen 4th Generation wRfx: NONREACTIVE
Hematocrit: 35.1 % (ref 34.0–46.6)
Hemoglobin: 11.5 g/dL (ref 11.1–15.9)
Immature Grans (Abs): 0.1 10*3/uL (ref 0.0–0.1)
Immature Granulocytes: 1 %
Lymphocytes Absolute: 1.2 10*3/uL (ref 0.7–3.1)
Lymphs: 13 %
MCH: 30.5 pg (ref 26.6–33.0)
MCHC: 32.8 g/dL (ref 31.5–35.7)
MCV: 93 fL (ref 79–97)
Monocytes Absolute: 0.4 10*3/uL (ref 0.1–0.9)
Monocytes: 4 %
Neutrophils Absolute: 7.7 10*3/uL — ABNORMAL HIGH (ref 1.4–7.0)
Neutrophils: 80 %
Platelets: 295 10*3/uL (ref 150–450)
RBC: 3.77 x10E6/uL (ref 3.77–5.28)
RDW: 12 % (ref 11.7–15.4)
RPR Ser Ql: NONREACTIVE
WBC: 9.6 10*3/uL (ref 3.4–10.8)

## 2023-01-17 NOTE — Telephone Encounter (Signed)
No evidence of OSA on sleep study She is a heavy snorer

## 2023-01-18 NOTE — Telephone Encounter (Signed)
Results routed to ordering provider via epic.

## 2023-01-21 ENCOUNTER — Other Ambulatory Visit: Payer: Self-pay

## 2023-01-21 ENCOUNTER — Ambulatory Visit: Payer: 59 | Attending: Maternal & Fetal Medicine

## 2023-01-21 DIAGNOSIS — O99512 Diseases of the respiratory system complicating pregnancy, second trimester: Secondary | ICD-10-CM | POA: Diagnosis not present

## 2023-01-21 DIAGNOSIS — E669 Obesity, unspecified: Secondary | ICD-10-CM

## 2023-01-21 DIAGNOSIS — O35HXX0 Maternal care for other (suspected) fetal abnormality and damage, fetal lower extremities anomalies, not applicable or unspecified: Secondary | ICD-10-CM

## 2023-01-21 DIAGNOSIS — Z3A27 27 weeks gestation of pregnancy: Secondary | ICD-10-CM | POA: Insufficient documentation

## 2023-01-21 DIAGNOSIS — J45909 Unspecified asthma, uncomplicated: Secondary | ICD-10-CM | POA: Diagnosis not present

## 2023-01-21 DIAGNOSIS — O99212 Obesity complicating pregnancy, second trimester: Secondary | ICD-10-CM | POA: Insufficient documentation

## 2023-01-21 DIAGNOSIS — O99213 Obesity complicating pregnancy, third trimester: Secondary | ICD-10-CM

## 2023-01-23 ENCOUNTER — Encounter: Payer: Self-pay | Admitting: Obstetrics and Gynecology

## 2023-01-25 NOTE — Telephone Encounter (Signed)
Spoke with patient regarding itching. She states it is not every night however usually at night all over the body, not localized to hands or feet. Denies new soaps or changes. Recommended hydrocortisone/anti itch creams, benadryl or oatmeal baths. Advised if itching becomes worse and unmanageable to call in or go to L&D to be evaluated. She verbalized understanding.

## 2023-02-01 ENCOUNTER — Encounter: Payer: Self-pay | Admitting: Obstetrics and Gynecology

## 2023-02-04 NOTE — Telephone Encounter (Signed)
You can stamp it.  She's currently pregnant so we're also seeing her for her prenatal care, she did have a NOB physical several months ago.

## 2023-02-06 ENCOUNTER — Encounter: Payer: Self-pay | Admitting: Obstetrics and Gynecology

## 2023-02-06 ENCOUNTER — Ambulatory Visit (INDEPENDENT_AMBULATORY_CARE_PROVIDER_SITE_OTHER): Payer: 59 | Admitting: Obstetrics and Gynecology

## 2023-02-06 VITALS — BP 104/59 | HR 104 | Wt 321.1 lb

## 2023-02-06 DIAGNOSIS — O99213 Obesity complicating pregnancy, third trimester: Secondary | ICD-10-CM | POA: Insufficient documentation

## 2023-02-06 DIAGNOSIS — O0993 Supervision of high risk pregnancy, unspecified, third trimester: Secondary | ICD-10-CM

## 2023-02-06 DIAGNOSIS — Z3403 Encounter for supervision of normal first pregnancy, third trimester: Secondary | ICD-10-CM

## 2023-02-06 DIAGNOSIS — O283 Abnormal ultrasonic finding on antenatal screening of mother: Secondary | ICD-10-CM

## 2023-02-06 DIAGNOSIS — E669 Obesity, unspecified: Secondary | ICD-10-CM

## 2023-02-06 DIAGNOSIS — Z3A29 29 weeks gestation of pregnancy: Secondary | ICD-10-CM

## 2023-02-06 DIAGNOSIS — O99210 Obesity complicating pregnancy, unspecified trimester: Secondary | ICD-10-CM | POA: Insufficient documentation

## 2023-02-06 HISTORY — DX: Abnormal ultrasonic finding on antenatal screening of mother: O28.3

## 2023-02-06 LAB — POCT URINALYSIS DIPSTICK OB
Bilirubin, UA: NEGATIVE
Blood, UA: NEGATIVE
Glucose, UA: NEGATIVE
Ketones, UA: NEGATIVE
Leukocytes, UA: NEGATIVE
Nitrite, UA: NEGATIVE
Spec Grav, UA: 1.015 (ref 1.010–1.025)
Urobilinogen, UA: 0.2 E.U./dL
pH, UA: 6.5 (ref 5.0–8.0)

## 2023-02-06 NOTE — Progress Notes (Signed)
ROB: Patient is a 32 y.o. G1P0000 at [redacted]w[redacted]d who presents for routine OB care.  Pregnancy is complicated by Obesity in pregnancy; and Abnormal fetal ultrasound (femoral hypoplasia). Patient has complaints of occasional bladder leakage but knows this is normal. Is scheduled for next growth scan on 5/20.  Discussed need for antenatal testing beginning 34 weeks and need for IOL in 39th week due to BMI. RTC in 2 weeks.

## 2023-02-06 NOTE — Progress Notes (Signed)
ROB [redacted]w[redacted]d: She is dong well. She reports good fetal movement. She has no new concerns today.

## 2023-02-09 ENCOUNTER — Encounter: Payer: Self-pay | Admitting: Physician Assistant

## 2023-02-11 ENCOUNTER — Telehealth: Payer: Self-pay

## 2023-02-11 DIAGNOSIS — H029 Unspecified disorder of eyelid: Secondary | ICD-10-CM

## 2023-02-11 NOTE — Telephone Encounter (Signed)
Nikiya sent the following message to Durward Parcel, PA-C in Epic:  I know it is the weekend but wanted to go ahead and send a message before I forget. I've had this lump in my eye since after the last time I came in. I've tried warm compresses and keeping it clean and nothing is seeming to work. My stepmom believes it is a clogged oil gland. I wanted to see if I could be seen or referred to somewhere at your next convenience. I have attached a photo   Ron ordered a referral to [facility].  Referral put in for [facility].  AMD

## 2023-02-12 ENCOUNTER — Encounter: Payer: Self-pay | Admitting: Physician Assistant

## 2023-02-12 ENCOUNTER — Ambulatory Visit: Payer: Self-pay | Admitting: Physician Assistant

## 2023-02-12 VITALS — BP 133/82 | HR 113 | Temp 97.8°F | Resp 16

## 2023-02-12 DIAGNOSIS — J020 Streptococcal pharyngitis: Secondary | ICD-10-CM

## 2023-02-12 LAB — POCT RAPID STREP A (OFFICE): Rapid Strep A Screen: NEGATIVE

## 2023-02-12 NOTE — Progress Notes (Signed)
Reports sore throat x 2 days and "itchy ears" which is unusual for her.  Stated may be allergies, but reports week 30 gestation and cannot take many pills.  Denies fever, cough, headache or any sinus pressure.  Taking lower intake fluid reported with sore throat and nausea.

## 2023-02-12 NOTE — Progress Notes (Signed)
   Subjective:sore throat    Patient ID: Melanie Frederick, female    DOB: [birth year], 32 y.o.   MRN: [id number]  HPI Complain of sore throat for 2 days.  [redacted] weeks gestation. Concern for strep   Review of Systems Negative except for compliant.    Objective:   Physical Exam BP 133/82  Pulse 113  Resp 16  Temp 97.8 F (36.6 C)  SpO2 98 %  Negative rapid strep.       Assessment & Plan:viral sore throat.   Advise OTC medication.

## 2023-02-19 ENCOUNTER — Encounter: Payer: Self-pay | Admitting: Obstetrics and Gynecology

## 2023-02-19 DIAGNOSIS — H0011 Chalazion right upper eyelid: Secondary | ICD-10-CM | POA: Diagnosis not present

## 2023-02-20 ENCOUNTER — Other Ambulatory Visit: Payer: Self-pay

## 2023-02-20 DIAGNOSIS — O99213 Obesity complicating pregnancy, third trimester: Secondary | ICD-10-CM

## 2023-02-20 DIAGNOSIS — J45909 Unspecified asthma, uncomplicated: Secondary | ICD-10-CM

## 2023-02-21 ENCOUNTER — Ambulatory Visit (INDEPENDENT_AMBULATORY_CARE_PROVIDER_SITE_OTHER): Payer: 59 | Admitting: Certified Nurse Midwife

## 2023-02-21 ENCOUNTER — Encounter: Payer: Self-pay | Admitting: Certified Nurse Midwife

## 2023-02-21 VITALS — BP 119/82 | HR 93 | Wt 326.0 lb

## 2023-02-21 DIAGNOSIS — Z3A31 31 weeks gestation of pregnancy: Secondary | ICD-10-CM

## 2023-02-21 DIAGNOSIS — Z3483 Encounter for supervision of other normal pregnancy, third trimester: Secondary | ICD-10-CM

## 2023-02-21 LAB — POCT URINALYSIS DIPSTICK OB
Bilirubin, UA: NEGATIVE
Blood, UA: NEGATIVE
Glucose, UA: NEGATIVE
Ketones, UA: NEGATIVE
Nitrite, UA: NEGATIVE
POC,PROTEIN,UA: NEGATIVE
Spec Grav, UA: 1.015 (ref 1.010–1.025)
Urobilinogen, UA: 0.2 E.U./dL
pH, UA: 6.5 (ref 5.0–8.0)

## 2023-02-21 NOTE — Patient Instructions (Signed)
Preterm Labor Pregnancy normally lasts 39-41 weeks. Preterm labor is when labor starts before you have been pregnant for 37 weeks. Babies who are born too early may have a higher risk for long-term problems like cerebral palsy or developmental delays. They may also have problems soon after birth, such as problems with blood sugar, body temperature, heart, and breathing. These problems may be very serious in babies who are born before 34 weeks of pregnancy. What are the causes? The cause of this condition is not known. What increases the risk? You are more likely to have preterm labor if: You have medical problems, now or in the past. You have problems now or in your past pregnancies. You have lifestyle problems. Medical history You have problems of the womb (uterus). You have an infection, including infections you get from sex. You have problems that do not go away, such as: Blood clots. High blood pressure. High blood sugar. You have low body weight or too much body weight. Present and past pregnancies You have had preterm labor before. You are pregnant with two babies or more. You have a condition in which the placenta covers your cervix. You waited less than 18 months between giving birth and becoming pregnant again. Your unborn baby has some problems. You have bleeding from your vagina. You became pregnant by a method called IVF. Lifestyle You smoke. You drink alcohol. You use drugs. You have stress. You have abuse in your home. You come in contact with chemicals that harm the body (pollutants). Other factors You are younger than 17 years or older than 35 years. What are the signs or symptoms? Symptoms of this condition include: Cramps. The cramps may feel like cramps from a period. You may also have watery poop (diarrhea). Pain in the belly (abdomen). Pain in the lower back. Regular contractions. It may feel like your belly is getting tighter. Pressure in the lower  belly. More fluid leaking from the vagina. The fluid may be watery or bloody. Water breaking. How is this treated? Treatment for this condition depends on your health, the health of your baby, and how old your pregnancy is. It may include: Taking medicines, such as: Hormone medicines. Medicines to stop contractions. Medicines to help mature the baby's lungs. Medicines to prevent your baby from getting cerebral palsy or other problems. Bed rest. If the labor happens before 34 weeks of pregnancy, you may need to stay in the hospital. Delivering the baby. Follow these instructions at home:  Do not smoke or use any products that contain nicotine or tobacco. If you need help quitting, ask your doctor. Do not drink alcohol. Take over-the-counter and prescription medicines only as told by your doctor. Rest as told by your doctor. Return to your normal activities when your doctor says that it is safe. Keep all follow-up visits. How is this prevented? To have a healthy pregnancy: Do not use drugs. Do not use any medicines unless you ask your doctor if they are safe for you. Talk with your doctor before taking any herbal supplements. Make sure you gain enough weight. Watch for infection. If you think you might have an infection, get it checked right away. Symptoms of infection may include: Fever. Vaginal discharge that smells bad or is not normal. Pain or burning when you pee. Needing to pee urgently. Needing to pee often. Peeing small amounts often. Blood in your pee. Pee that smells bad or unusual. Where to find more information U.S. Department of Health and Human Services   Office on Women's Health: www.womenshealth.gov The American College of Obstetricians and Gynecologists: www.acog.org Centers for Disease Control and Prevention: www.cdc.gov Contact a doctor if: You think you are going into preterm labor. You have symptoms of preterm labor. You have symptoms of infection. Get help  right away if: You are having painful contractions every 5 minutes or less. Your water breaks. Summary Preterm labor is labor that starts before you reach 37 weeks of pregnancy. Your baby may have problems if delivered early. You are more likely to have preterm labor if you have certain medical problems or problems with a pregnancy now or in the past. Some lifestyle factors can also increase the risk. Contact a doctor if you have symptoms of preterm labor. This information is not intended to replace advice given to you by your health care provider. Make sure you discuss any questions you have with your health care provider. Document Revised: 09/27/2020 Document Reviewed: 09/27/2020 Elsevier Patient Education  2023 Elsevier Inc.  

## 2023-02-21 NOTE — Addendum Note (Signed)
Addended by: Fonda Kinder on: 02/21/2023 10:34 AM   Modules accepted: Orders

## 2023-02-21 NOTE — Progress Notes (Signed)
Body mass index is 51.06 kg/m. ROB doing well, Feeling good movement. Discussed rpt anesthesia consult 34 wks , ordered. Melanie Frederick to call to schedule. She denies any questions today. She has MFM on Monday. Follow up for ROB in 2 wks.   Fundus difficult to appreciate due to body habitus   Doreene Burke, CNM

## 2023-02-25 ENCOUNTER — Ambulatory Visit: Payer: 59 | Attending: Obstetrics and Gynecology

## 2023-02-25 ENCOUNTER — Other Ambulatory Visit: Payer: Self-pay

## 2023-02-25 DIAGNOSIS — O99213 Obesity complicating pregnancy, third trimester: Secondary | ICD-10-CM | POA: Diagnosis not present

## 2023-02-25 DIAGNOSIS — Z3A32 32 weeks gestation of pregnancy: Secondary | ICD-10-CM | POA: Diagnosis not present

## 2023-02-25 DIAGNOSIS — E669 Obesity, unspecified: Secondary | ICD-10-CM | POA: Diagnosis not present

## 2023-02-25 DIAGNOSIS — O35HXX0 Maternal care for other (suspected) fetal abnormality and damage, fetal lower extremities anomalies, not applicable or unspecified: Secondary | ICD-10-CM | POA: Diagnosis not present

## 2023-02-25 DIAGNOSIS — O99215 Obesity complicating the puerperium: Secondary | ICD-10-CM | POA: Diagnosis not present

## 2023-02-25 DIAGNOSIS — J45909 Unspecified asthma, uncomplicated: Secondary | ICD-10-CM

## 2023-02-25 DIAGNOSIS — O359XX0 Maternal care for (suspected) fetal abnormality and damage, unspecified, not applicable or unspecified: Secondary | ICD-10-CM | POA: Insufficient documentation

## 2023-02-28 ENCOUNTER — Encounter: Payer: Self-pay | Admitting: Obstetrics and Gynecology

## 2023-02-28 ENCOUNTER — Other Ambulatory Visit: Payer: Self-pay | Admitting: Obstetrics and Gynecology

## 2023-03-03 ENCOUNTER — Encounter: Payer: Self-pay | Admitting: Obstetrics and Gynecology

## 2023-03-08 ENCOUNTER — Telehealth: Payer: Self-pay | Admitting: Certified Nurse Midwife

## 2023-03-08 ENCOUNTER — Ambulatory Visit (INDEPENDENT_AMBULATORY_CARE_PROVIDER_SITE_OTHER): Payer: 59 | Admitting: Certified Nurse Midwife

## 2023-03-08 ENCOUNTER — Encounter: Payer: Self-pay | Admitting: Certified Nurse Midwife

## 2023-03-08 VITALS — BP 113/78 | HR 106 | Wt 324.8 lb

## 2023-03-08 DIAGNOSIS — Z3A33 33 weeks gestation of pregnancy: Secondary | ICD-10-CM

## 2023-03-08 DIAGNOSIS — Z3483 Encounter for supervision of other normal pregnancy, third trimester: Secondary | ICD-10-CM

## 2023-03-08 LAB — POCT URINALYSIS DIPSTICK OB
Bilirubin, UA: NEGATIVE
Blood, UA: NEGATIVE
Glucose, UA: NEGATIVE
Ketones, UA: NEGATIVE
Leukocytes, UA: NEGATIVE
Nitrite, UA: NEGATIVE
POC,PROTEIN,UA: NEGATIVE
Spec Grav, UA: 1.01 (ref 1.010–1.025)
Urobilinogen, UA: 0.2 E.U./dL
pH, UA: 6.5 (ref 5.0–8.0)

## 2023-03-08 NOTE — Progress Notes (Signed)
Body mass index is 50.87 kg/m. ROB no concerns today. Feeling good fetal movement. Discussed : repeat anesthesia consult, GBS testing, and NST . She verbalizes and agrees.   Fundal height difficult to assess due to body habitus.   Follow up 2 weeks.    Doreene Burke, CNM

## 2023-03-08 NOTE — Telephone Encounter (Signed)
The patient was advised to scheduled for 36 week NST and ROB. The patient states she will be out of town in that time frame June 17 thru 22. The patient is scheduled for 6/17 for NST and ROB

## 2023-03-08 NOTE — Patient Instructions (Signed)
[place]  [facility]  [address], [place], [place] [address]  Phone: [phone]  [facility] (second location)  [address] [place], [place] [address]  Phone: [phone]  [facility] ([place]) [address], [place], [place] [address] Phone: [phone]  [facility]  [address] [place],  [address]  Phone: [phone] 

## 2023-03-14 ENCOUNTER — Encounter: Payer: Self-pay | Admitting: Obstetrics and Gynecology

## 2023-03-17 ENCOUNTER — Encounter: Payer: Self-pay | Admitting: Obstetrics and Gynecology

## 2023-03-20 ENCOUNTER — Encounter: Payer: Self-pay | Admitting: Obstetrics and Gynecology

## 2023-03-20 ENCOUNTER — Encounter
Admission: RE | Admit: 2023-03-20 | Discharge: 2023-03-20 | Disposition: A | Discharge status: Home / Self Care | Payer: 59 | Source: Ambulatory Visit | Attending: Obstetrics and Gynecology | Admitting: Obstetrics and Gynecology

## 2023-03-20 NOTE — Progress Notes (Signed)
[facility] Anesthesia Consultation  Melanie Frederick ZOX:[id number] DOB: Apr 16, 1991 DOA: 03/20/2023 PCP: Patient, No Pcp Per   Requesting physician: Valentino Saxon Date of consultation: 03/20/23 Reason for consultation: Obesity during pregnancy  CHIEF COMPLAINT:  Obesity during pregnancy  HISTORY OF PRESENT ILLNESS: Melanie Frederick  is a 32 y.o. female with a known history of morbid obesity and asthma.  PAST MEDICAL HISTORY:   Past Medical History:  Diagnosis Date   Anxiety    Asthma 10/24/2022   Fatigue    History of COVID-19 11/24/2020   Diagnosed January 2022   History of COVID-19 11/24/2020   Diagnosed January 2022   Nevus    Vaccine for human papilloma virus (HPV) types 6, 11, 16, and 18 administered     PAST SURGICAL HISTORY:  Past Surgical History:  Procedure Laterality Date   ARM SKIN LESION BIOPSY / EXCISION Left 2012   LYMPH NODE DISSECTION Right 2011   MOLE REMOVAL Left    moval removal off of back    SOCIAL HISTORY:  Social History   Tobacco Use   Smoking status: Never   Smokeless tobacco: Never  Substance Use Topics   Alcohol use: Not Currently    Comment: occasinally before pregnancy    FAMILY HISTORY:  Family History  Problem Relation Age of Onset   Colon polyps Mother    Gout Brother    Migraines Brother    Hypothyroidism Paternal Grandmother    Heart attack Paternal Grandmother    Brain cancer Other        malignant, great mat uncle-60   Cancer Other 60       colon cancer    DRUG ALLERGIES:  Allergies  Allergen Reactions   Mushroom Extract Complex Hives and Swelling   Prednisone Palpitations    REVIEW OF SYSTEMS:   RESPIRATORY: No cough, shortness of breath, wheezing.  CARDIOVASCULAR: No chest pain, orthopnea, edema.  HEMATOLOGY: No anemia, easy bruising or bleeding SKIN: No rash or lesion. NEUROLOGIC: No tingling, numbness, weakness.  PSYCHIATRY: No anxiety or depression.   MEDICATIONS AT  HOME:  Prior to Admission medications   Medication Sig Start Date End Date Taking? Authorizing Provider  aspirin EC 81 MG tablet Take 1 tablet (81 mg total) by mouth daily. Take after 12 weeks for prevention of preeclampssia later in pregnancy 10/24/22   Hildred Laser, MD  Doxylamine-Pyridoxine 10-10 MG TBEC TAKE 2TABS BY MOUTH AT BEDTIME. IF SYMPTOMS PERSIST, ADD 1TAB IN THE AM AND 1TAB IN PM 03/01/23   Hildred Laser, MD  prednisoLONE acetate (PRED FORTE) 1 % ophthalmic suspension SMARTSIG:In Eye(s) Patient not taking: Reported on 03/08/2023 02/19/23   [provider]  Prenatal Vit-Fe Fumarate-FA (MULTIVITAMIN-PRENATAL) 27-0.8 MG TABS tablet Take 1 tablet by mouth daily at 12 noon.    [provider]      PHYSICAL EXAMINATION:   VITAL SIGNS: Last menstrual period 07/08/2022.  GENERAL:  32 y.o.-year-old patient no acute distress.  HEENT: Head atraumatic, normocephalic. Oropharynx and nasopharynx clear. MP 2, TM distance >3 cm, normal mouth opening. LUNGS: No use of accessory muscles of respiration.   EXTREMITIES: No pedal edema, cyanosis, or clubbing.  NEUROLOGIC: normal gait PSYCHIATRIC: The patient is alert and oriented x 3.  SKIN: No obvious rash, lesion, or ulcer.    IMPRESSION AND PLAN:   Melanie Frederick  is a 32 y.o. female presenting with obesity during pregnancy. BMI is currently 51.4 at [redacted] weeks gestation.   We discussed analgesic options during labor  including epidural analgesia. Discussed that in obesity there can be increased difficulty with epidural placement or even failure of successful epidural. We also discussed that even after successful epidural placement there is increased risk of catheter migration out of the epidural space that would require catheter replacement. Discussed use of epidural vs spinal vs GA if cesarean delivery is required. Discussed increased risk of difficult intubation during pregnancy should an emergency cesarean delivery be  required.

## 2023-03-22 ENCOUNTER — Ambulatory Visit (INDEPENDENT_AMBULATORY_CARE_PROVIDER_SITE_OTHER): Payer: 59

## 2023-03-22 VITALS — BP 129/69 | HR 112 | Resp 16 | Ht 67.0 in | Wt 331.3 lb

## 2023-03-22 DIAGNOSIS — R300 Dysuria: Secondary | ICD-10-CM

## 2023-03-22 LAB — POCT URINALYSIS DIPSTICK OB
Bilirubin, UA: NEGATIVE
Blood, UA: NEGATIVE
Glucose, UA: NEGATIVE
Ketones, UA: NEGATIVE
Nitrite, UA: NEGATIVE
Spec Grav, UA: 1.01 (ref 1.010–1.025)
Urobilinogen, UA: 0.2 E.U./dL
pH, UA: 7 (ref 5.0–8.0)

## 2023-03-22 NOTE — Progress Notes (Signed)
    NURSE VISIT NOTE  Subjective:    Patient ID: LUCENDA TRICKEL, female    DOB: Feb 02, 1991, 32 y.o.   MRN: [id number]       HPI  Patient is a 32 y.o. G69P0000 female who presents for dysuria for 1 day.  Patient denies urinary urgency, flank pain, and pelvic pain.  Patient does not have a history of recurrent UTI.  Patient does not have a history of pyelonephritis.    Objective:    BP 129/69   Pulse (!) 112   Resp 16   Ht 5\' 7"  (1.702 m)   Wt (!) 331 lb 4.8 oz (150.3 kg)   LMP 07/08/2022 (Exact Date)   BMI 51.89 kg/m    Lab Review  Results for orders placed or performed in visit on 03/22/23  POC Urinalysis Dipstick OB  Result Value Ref Range   Color, UA     Clarity, UA     Glucose, UA Negative Negative   Bilirubin, UA Negative    Ketones, UA Negative    Spec Grav, UA 1.010 1.010 - 1.025   Blood, UA Negative    pH, UA 7.0 5.0 - 8.0   POC,PROTEIN,UA Small (1+) Negative, Trace, Small (1+), Moderate (2+), Large (3+), 4+   Urobilinogen, UA 0.2 0.2 or 1.0 E.U./dL   Nitrite, UA Negative    Leukocytes, UA Moderate (2+) (A) Negative   Appearance     Odor      Assessment:   1. Dysuria      Plan:   Urine Culture Sent. Maintain adequate hydration.  May use AZO OTC prn.  Follow up if symptoms worsen or fail to improve as anticipated, and as needed.    Santiago Bumpers, CMA [facility]

## 2023-03-26 ENCOUNTER — Telehealth: Payer: Self-pay

## 2023-03-26 NOTE — Telephone Encounter (Signed)
Pt called after hour nurse 03/25/23 8:37pm 36wks; has a rash; first noticed it 1-2 wks ago on her abdomen; rash has now spread to bottom, arms, legs, hands, and feet; She has not yet been seen or evaluated for it.  Pt has tried hydrocortisone, Benadryl cream, oatmean cream, and aloe.  At first it was working but not working like before. Rash is very itchy, red and raised.  Pt is allergic to mushrooms but has not been in contact with them.  No dyspnea or trouble swallowing; denies abd and pelvic pain; just pressure but not different than her normal; no vaginal bleeding or fluid leakage; no decreased fetal movement; no vomiting; no fever.  After hour nurse adv pt to see PCP within 24h; to call and schedule appt; moisturize the skin with lotion - eucerin, lubriderm, or vaseline intensive care; avoid harsh soaps, strong soaps. Use Joan Mayans or Basis.  [phone]  Called pt and adv her to take oral benadryl and continue the hydrocortisone; to apply lotion after a shower.  If no better or gets worse to be seen sooner than the 24th.  Pt states she is at [facility]; adv the sun may make the rash worse; pt says she isn't in the like that; to stay hydrated.

## 2023-03-27 ENCOUNTER — Other Ambulatory Visit: Payer: Self-pay

## 2023-03-27 DIAGNOSIS — O99213 Obesity complicating pregnancy, third trimester: Secondary | ICD-10-CM

## 2023-03-27 LAB — URINE CULTURE

## 2023-03-29 DIAGNOSIS — Z3A37 37 weeks gestation of pregnancy: Secondary | ICD-10-CM | POA: Insufficient documentation

## 2023-03-29 NOTE — Progress Notes (Unsigned)
    NURSE VISIT NOTE  Subjective:    Patient ID: Melanie Frederick, female    DOB: [birth year], 32 y.o.   MRN: [id number]  HPI  Patient is a 32 y.o. G66P0000 female who presents for fetal monitoring per order from Doreene Burke, CNM. Patient also reports continued itching of rash on abdomen, arms, hands and legs with temporary relief after using OTC benadryl cream.  Objective:    BP 102/62   Pulse 92   Ht 5\' 7"  (1.702 m)   Wt (!) 333 lb 14.4 oz (151.5 kg)   LMP 07/08/2022 (Exact Date)   BMI 52.30 kg/m  Estimated Date of Delivery: 04/22/23  [redacted]w[redacted]d  Fetus A Non-Stress Test Interpretation for 04/01/23  Indication: Obesity  Fetal Heart Rate A Mode: External Baseline Rate (A): 155 bpm Variability: Moderate Accelerations: 15 x 15 Decelerations: None Multiple birth?: No  Uterine Activity Mode: Toco Contraction Frequency (min): None  Interpretation (Fetal Testing) Nonstress Test Interpretation: Reactive Overall Impression: Reassuring for gestational age   Assessment:   1. Obesity affecting pregnancy in third trimester, unspecified obesity type   2. [redacted] weeks gestation of pregnancy      Plan:   Results reviewed by Tresea Mall, CNM and discussed with patient. Patient advised per Erskine Squibb to take oral benadryl and report if no relief after 3-4 doses.    Rocco Serene, LPN

## 2023-03-29 NOTE — Patient Instructions (Signed)

## 2023-04-01 ENCOUNTER — Ambulatory Visit: Payer: 59 | Attending: Maternal & Fetal Medicine

## 2023-04-01 ENCOUNTER — Ambulatory Visit (INDEPENDENT_AMBULATORY_CARE_PROVIDER_SITE_OTHER): Payer: 59

## 2023-04-01 ENCOUNTER — Other Ambulatory Visit: Payer: Self-pay

## 2023-04-01 ENCOUNTER — Encounter: Payer: 59 | Admitting: Obstetrics

## 2023-04-01 VITALS — BP 102/62 | HR 92 | Ht 67.0 in | Wt 333.9 lb

## 2023-04-01 DIAGNOSIS — E669 Obesity, unspecified: Secondary | ICD-10-CM

## 2023-04-01 DIAGNOSIS — Z3A37 37 weeks gestation of pregnancy: Secondary | ICD-10-CM | POA: Insufficient documentation

## 2023-04-01 DIAGNOSIS — Z362 Encounter for other antenatal screening follow-up: Secondary | ICD-10-CM | POA: Insufficient documentation

## 2023-04-01 DIAGNOSIS — O35HXX0 Maternal care for other (suspected) fetal abnormality and damage, fetal lower extremities anomalies, not applicable or unspecified: Secondary | ICD-10-CM | POA: Diagnosis not present

## 2023-04-01 DIAGNOSIS — O99213 Obesity complicating pregnancy, third trimester: Secondary | ICD-10-CM | POA: Diagnosis not present

## 2023-04-01 DIAGNOSIS — O359XX0 Maternal care for (suspected) fetal abnormality and damage, unspecified, not applicable or unspecified: Secondary | ICD-10-CM | POA: Diagnosis not present

## 2023-04-03 ENCOUNTER — Encounter: Payer: Self-pay | Admitting: Advanced Practice Midwife

## 2023-04-05 ENCOUNTER — Other Ambulatory Visit (HOSPITAL_COMMUNITY)
Admission: RE | Admit: 2023-04-05 | Discharge: 2023-04-05 | Disposition: A | Discharge status: Home / Self Care | Payer: 59 | Source: Ambulatory Visit | Attending: Obstetrics | Admitting: Obstetrics

## 2023-04-05 ENCOUNTER — Ambulatory Visit (INDEPENDENT_AMBULATORY_CARE_PROVIDER_SITE_OTHER): Payer: 59 | Admitting: Certified Nurse Midwife

## 2023-04-05 ENCOUNTER — Ambulatory Visit (INDEPENDENT_AMBULATORY_CARE_PROVIDER_SITE_OTHER): Payer: 59

## 2023-04-05 ENCOUNTER — Encounter: Payer: Self-pay | Admitting: Certified Nurse Midwife

## 2023-04-05 VITALS — BP 132/83 | HR 105 | Wt 333.2 lb

## 2023-04-05 VITALS — BP 132/83 | HR 105 | Ht 67.0 in | Wt 333.2 lb

## 2023-04-05 DIAGNOSIS — O99213 Obesity complicating pregnancy, third trimester: Secondary | ICD-10-CM

## 2023-04-05 DIAGNOSIS — Z3A37 37 weeks gestation of pregnancy: Secondary | ICD-10-CM

## 2023-04-05 DIAGNOSIS — Z3483 Encounter for supervision of other normal pregnancy, third trimester: Secondary | ICD-10-CM

## 2023-04-05 DIAGNOSIS — Z3685 Encounter for antenatal screening for Streptococcus B: Secondary | ICD-10-CM | POA: Diagnosis not present

## 2023-04-05 DIAGNOSIS — Z113 Encounter for screening for infections with a predominantly sexual mode of transmission: Secondary | ICD-10-CM | POA: Insufficient documentation

## 2023-04-05 DIAGNOSIS — E669 Obesity, unspecified: Secondary | ICD-10-CM | POA: Diagnosis not present

## 2023-04-05 DIAGNOSIS — O0993 Supervision of high risk pregnancy, unspecified, third trimester: Secondary | ICD-10-CM

## 2023-04-05 DIAGNOSIS — R21 Rash and other nonspecific skin eruption: Secondary | ICD-10-CM | POA: Diagnosis not present

## 2023-04-05 LAB — POCT URINALYSIS DIPSTICK OB
Bilirubin, UA: NEGATIVE
Blood, UA: NEGATIVE
Glucose, UA: NEGATIVE
Ketones, UA: NEGATIVE
Leukocytes, UA: NEGATIVE
Nitrite, UA: NEGATIVE
POC,PROTEIN,UA: NEGATIVE
Spec Grav, UA: 1.02 (ref 1.010–1.025)
Urobilinogen, UA: 0.2 E.U./dL
pH, UA: 5 (ref 5.0–8.0)

## 2023-04-05 MED ORDER — HYDROXYZINE PAMOATE 25 MG PO CAPS: 25.0000 mg | ORAL_CAPSULE | Freq: Four times a day (QID) | ORAL | 0 refills | Status: DC | PRN

## 2023-04-05 NOTE — Patient Instructions (Signed)
.  Braxton Hicks Contractions  Contractions of the uterus can occur throughout pregnancy, but they are not always a sign that you are in labor. You may have practice contractions called Braxton Hicks contractions. These false labor contractions are sometimes confused with true labor. What are Braxton Hicks contractions? Braxton Hicks contractions are tightening movements that occur in the muscles of the uterus before labor. Unlike true labor contractions, these contractions do not result in opening (dilation) and thinning of the lowest part of the uterus (cervix). Toward the end of pregnancy (32-34 weeks), Braxton Hicks contractions can happen more often and may become stronger. These contractions are sometimes difficult to tell apart from true labor because they can be very uncomfortable. How to tell the difference between true labor and false labor True labor Contractions last 30-70 seconds. Contractions become very regular. Discomfort is usually felt in the top of the uterus, and it spreads to the lower abdomen and low back. Contractions do not go away with walking. Contractions usually become stronger and more frequent. The cervix dilates and gets thinner. False labor Contractions are usually shorter, weaker, and farther apart than true labor contractions. Contractions are usually irregular. Contractions are often felt in the front of the lower abdomen and in the groin. Contractions may go away when you walk around or change positions while lying down. The cervix usually does not dilate or become thin. Sometimes, the only way to tell if you are in true labor is for your health care provider to look for changes in your cervix. Your health care provider will do a physical exam and may monitor your contractions. If you are in true labor, your health care provider will send you home with instructions about when to return to the hospital. You may continue to have Braxton Hicks contractions until  you go into true labor. Follow these instructions at home:  Take over-the-counter and prescription medicines only as told by your health care provider. If Braxton Hicks contractions are making you uncomfortable: Change your position from lying down or resting to walking, or change from walking to resting. Sit and rest in a tub of warm water. Drink enough fluid to keep your urine pale yellow. Dehydration may cause these contractions. Do slow and deep breathing several times an hour. Keep all follow-up visits. This is important. Contact a health care provider if: You have a fever. You have continuous pain in your abdomen. Your contractions become stronger, more regular, and closer together. You pass blood-tinged mucus. Get help right away if: You have fluid leaking or gushing from your vagina. You have bright red blood coming from your vagina. Your baby is not moving inside you as much as it used to. Summary You may have practice contractions called Braxton Hicks contractions. These false labor contractions are sometimes confused with true labor. Braxton Hicks contractions are usually shorter, weaker, farther apart, and less regular than true labor contractions. True labor contractions usually become stronger, more regular, and more frequent. Manage discomfort from Braxton Hicks contractions by changing position, resting in a warm bath, practicing deep breathing, and drinking plenty of water. Keep all follow-up visits. Contact your health care provider if your contractions become stronger, more regular, and closer together. This information is not intended to replace advice given to you by your health care provider. Make sure you discuss any questions you have with your health care provider. Document Revised: 08/01/2020 Document Reviewed: 08/01/2020 Elsevier Patient Education  2024 [employer]  

## 2023-04-05 NOTE — Patient Instructions (Signed)

## 2023-04-05 NOTE — Progress Notes (Signed)
ROB and NST . Pt state she is feeling movement. She has c/o of rash. She does not that she has some itching. Labs for cholestasis done today. Order placed for hydroxyzine. Discussed likely pupps rash. Pt verbalizes understanding. Will work on getting pt scheduled for induction 39 wks per MFM recommendations.   GBS & cultures collected today.   SVE per pt request. 1/60/-2   Follow up 1 wk for ROB and NST.   Doreene Burke, CNM   Body mass index is 50.66 kg/m.

## 2023-04-05 NOTE — Progress Notes (Signed)
    NURSE VISIT NOTE  Subjective:    Patient ID: Melanie Frederick, female    DOB: May 04, 1991, 32 y.o.   MRN: [id number]  HPI  Patient is a 32 y.o. G17P0000 female who presents for fetal monitoring per order from Tresea Mall, CNM.   Objective:    BP 132/83   Pulse (!) 105   Ht 5\' 7"  (1.702 m)   Wt (!) 333 lb 3.2 oz (151.1 kg)   LMP 07/08/2022 (Exact Date)   BMI 52.19 kg/m  Estimated Date of Delivery: 04/22/23  [redacted]w[redacted]d  Fetus A Non-Stress Test Interpretation for 04/05/23  Indication: Obesity  Fetal Heart Rate A Mode: External Baseline Rate (A): 150 bpm Variability: Moderate Accelerations: 15 x 15 Decelerations: None Multiple birth?: No  Uterine Activity Mode: Toco Contraction Frequency (min): None  Interpretation (Fetal Testing) Nonstress Test Interpretation: Reactive Overall Impression: Reassuring for gestational age   Assessment:   1. Obesity affecting pregnancy in third trimester, unspecified obesity type   2. [redacted] weeks gestation of pregnancy      Plan:   Results reviewed and discussed with patient by  Doreene Burke, CNM.     Rocco Serene, LPN

## 2023-04-07 LAB — COMPREHENSIVE METABOLIC PANEL
ALT: 14 IU/L (ref 0–32)
AST: 19 IU/L (ref 0–40)
Albumin: 3.4 g/dL — ABNORMAL LOW (ref 4.0–5.0)
Alkaline Phosphatase: 124 IU/L — ABNORMAL HIGH (ref 44–121)
BUN/Creatinine Ratio: 8 — ABNORMAL LOW (ref 9–23)
BUN: 6 mg/dL (ref 6–20)
Bilirubin Total: <0.2 mg/dL (ref 0.0–1.2)
CO2: 17 mmol/L — ABNORMAL LOW (ref 20–29)
Calcium: 8.9 mg/dL (ref 8.7–10.2)
Chloride: 104 mmol/L (ref 96–106)
Creatinine, Ser: 0.8 mg/dL (ref 0.57–1.00)
Globulin, Total: 2.8 g/dL (ref 1.5–4.5)
Glucose: 109 mg/dL — ABNORMAL HIGH (ref 70–99)
Potassium: 4.4 mmol/L (ref 3.5–5.2)
Sodium: 136 mmol/L (ref 134–144)
Total Protein: 6.2 g/dL (ref 6.0–8.5)
eGFR: 102 mL/min/{1.73_m2} (ref >59)

## 2023-04-07 LAB — BILE ACIDS, TOTAL: Bile Acids Total: 3.9 umol/L (ref 0.0–10.0)

## 2023-04-08 ENCOUNTER — Encounter: Payer: Self-pay | Admitting: Certified Nurse Midwife

## 2023-04-08 DIAGNOSIS — O135 Gestational [pregnancy-induced] hypertension without significant proteinuria, complicating the puerperium: Secondary | ICD-10-CM | POA: Diagnosis not present

## 2023-04-08 DIAGNOSIS — Z8616 Personal history of COVID-19: Secondary | ICD-10-CM | POA: Diagnosis not present

## 2023-04-08 DIAGNOSIS — Z3A38 38 weeks gestation of pregnancy: Secondary | ICD-10-CM | POA: Insufficient documentation

## 2023-04-08 NOTE — Patient Instructions (Signed)

## 2023-04-08 NOTE — Progress Notes (Signed)
    NURSE VISIT NOTE  Subjective:    Patient ID: Melanie Frederick, female    DOB: 1991/02/11, 32 y.o.   MRN: [id number]  HPI  Patient is a 32 y.o. G61P0000 female who presents for fetal monitoring per order from Doreene Burke, CNM. Patient reports having crumble cookie and sweet tea prior to her visit today.  Objective:    BP 126/83   Pulse (!) 105   Ht 5\' 7"  (1.702 m)   Wt (!) 336 lb 9.6 oz (152.7 kg)   LMP 07/08/2022 (Exact Date)   BMI 52.72 kg/m  Estimated Date of Delivery: 04/22/23  [redacted]w[redacted]d  Fetus A Non-Stress Test Interpretation for 04/12/23  Indication: Obesity  Fetal Heart Rate A Mode: External Baseline Rate (A): 160 bpm Variability: Moderate Accelerations: 15 x 15 Decelerations: None Multiple birth?: No  Uterine Activity Mode: Toco Contraction Frequency (min): None  Interpretation (Fetal Testing) Nonstress Test Interpretation: Reactive Overall Impression: Reassuring for gestational age   Assessment:   1. Obesity affecting pregnancy in third trimester, unspecified obesity type   2. [redacted] weeks gestation of pregnancy      Plan:   Results reviewed by  Hildred Laser, MD and discussed with patient. Patient advised to drink plenty of water after leaving office.    Rocco Serene, LPN

## 2023-04-09 ENCOUNTER — Encounter: Payer: Self-pay | Admitting: Obstetrics and Gynecology

## 2023-04-09 LAB — CERVICOVAGINAL ANCILLARY ONLY
Chlamydia: NEGATIVE
Comment: NEGATIVE
Comment: NORMAL
Neisseria Gonorrhea: NEGATIVE

## 2023-04-09 LAB — CULTURE, BETA STREP (GROUP B ONLY): Strep Gp B Culture: NEGATIVE

## 2023-04-10 NOTE — Telephone Encounter (Signed)
Talked to pt and I have resent Dr. Oretha Milch letter.

## 2023-04-12 ENCOUNTER — Ambulatory Visit (INDEPENDENT_AMBULATORY_CARE_PROVIDER_SITE_OTHER): Payer: 59

## 2023-04-12 VITALS — BP 126/83 | HR 105 | Ht 67.0 in | Wt 336.6 lb

## 2023-04-12 DIAGNOSIS — O99213 Obesity complicating pregnancy, third trimester: Secondary | ICD-10-CM | POA: Diagnosis not present

## 2023-04-12 DIAGNOSIS — Z3A38 38 weeks gestation of pregnancy: Secondary | ICD-10-CM | POA: Diagnosis not present

## 2023-04-12 DIAGNOSIS — E669 Obesity, unspecified: Secondary | ICD-10-CM

## 2023-04-15 ENCOUNTER — Ambulatory Visit (INDEPENDENT_AMBULATORY_CARE_PROVIDER_SITE_OTHER): Payer: 59 | Admitting: Licensed Practical Nurse

## 2023-04-15 ENCOUNTER — Encounter: Payer: Self-pay | Admitting: Obstetrics and Gynecology

## 2023-04-15 ENCOUNTER — Encounter: Payer: Self-pay | Admitting: Licensed Practical Nurse

## 2023-04-15 VITALS — BP 126/86 | HR 96 | Wt 334.4 lb

## 2023-04-15 DIAGNOSIS — Z3483 Encounter for supervision of other normal pregnancy, third trimester: Secondary | ICD-10-CM

## 2023-04-15 DIAGNOSIS — O99213 Obesity complicating pregnancy, third trimester: Secondary | ICD-10-CM

## 2023-04-15 DIAGNOSIS — Z3A39 39 weeks gestation of pregnancy: Secondary | ICD-10-CM

## 2023-04-15 LAB — POCT URINALYSIS DIPSTICK
Bilirubin, UA: NEGATIVE
Blood, UA: NEGATIVE
Glucose, UA: NEGATIVE
Ketones, UA: NEGATIVE
Leukocytes, UA: NEGATIVE
Nitrite, UA: NEGATIVE
Protein, UA: NEGATIVE
Spec Grav, UA: 1.015 (ref 1.010–1.025)
Urobilinogen, UA: 0.2 E.U./dL
pH, UA: 6.5 (ref 5.0–8.0)

## 2023-04-15 NOTE — Progress Notes (Signed)
Routine Prenatal Care Visit  Subjective  Melanie Frederick is a 32 y.o. G1P0000 at [redacted]w[redacted]d being seen today for ongoing prenatal care.  She is currently monitored for the following issues for this high-risk pregnancy and has BMI 45.0-49.9, adult (HCC); First pregnancy; Obesity affecting pregnancy in third trimester; Abnormal fetal ultrasound; [redacted] weeks gestation of pregnancy; and [redacted] weeks gestation of pregnancy on their problem list.  ----------------------------------------------------------------------------------- Patient reports no complaints.   Doing well. Feels ready for baby. Her mother and boyfriend will be will her in labor.  Mood has been good, but has bene irritable.   Contractions: Irritability. Vag. Bleeding: None.  Movement: Present. Leaking Fluid denies.  ----------------------------------------------------------------------------------- The following portions of the patient's history were reviewed and updated as appropriate: allergies, current medications, past family history, past medical history, past social history, past surgical history and problem list. Problem list updated.  Objective  Blood pressure (!) 119/55, pulse (!) 105, weight (!) 334 lb 6.4 oz (151.7 kg), last menstrual period 07/08/2022. Pregravid weight 290 lb (131.5 kg) Total Weight Gain 44 lb 6.4 oz (20.1 kg) Urinalysis: Urine Protein    Urine Glucose    Fetal Status: Fetal Heart Rate (bpm): 155   Movement: Present  Presentation: Vertex  General:  Alert, oriented and cooperative. Patient is in no acute distress.  Skin: Skin is warm and dry. No rash noted.   Cardiovascular: Normal heart rate noted  Respiratory: Normal respiratory effort, no problems with respiration noted  Abdomen: Soft, gravid, appropriate for gestational age. Pain/Pressure: Present     Pelvic:  Cervical exam performed Dilation: 1 Effacement (%): 50 Station: -1  Extremities: Normal range of motion.  Edema: Trace  Mental Status: Normal mood  and affect. Normal behavior. Normal judgment and thought content.   Assessment   32 y.o. G1P0000 at [redacted]w[redacted]d by  04/22/2023, by Ultrasound presenting for routine prenatal visit  Plan   G1 Problems (from 09/26/22 to present)     No problems associated with this episode.        Term labor symptoms and general obstetric precautions including but not limited to vaginal bleeding, contractions, leaking of fluid and fetal movement were reviewed in detail with the patient. Please refer to After Visit Summary for other counseling recommendations.   Return for scheduled IOL .  Carie Caddy, CNM  [facility]  04/19/23  2:43 PM

## 2023-04-17 ENCOUNTER — Other Ambulatory Visit: Payer: Self-pay

## 2023-04-17 ENCOUNTER — Inpatient Hospital Stay
Admission: EM | Admit: 2023-04-17 | Discharge: 2023-04-19 | DRG: 788 | Disposition: A | Discharge status: Other Acute Inpatient Hospital | Payer: 59 | Attending: Obstetrics | Admitting: Obstetrics

## 2023-04-17 ENCOUNTER — Inpatient Hospital Stay: Payer: 59 | Admitting: Anesthesiology

## 2023-04-17 ENCOUNTER — Encounter: Payer: Self-pay | Admitting: Obstetrics

## 2023-04-17 DIAGNOSIS — Z98891 History of uterine scar from previous surgery: Secondary | ICD-10-CM | POA: Diagnosis not present

## 2023-04-17 DIAGNOSIS — Z3A39 39 weeks gestation of pregnancy: Secondary | ICD-10-CM

## 2023-04-17 DIAGNOSIS — O324XX0 Maternal care for high head at term, not applicable or unspecified: Secondary | ICD-10-CM | POA: Diagnosis not present

## 2023-04-17 DIAGNOSIS — O135 Gestational [pregnancy-induced] hypertension without significant proteinuria, complicating the puerperium: Secondary | ICD-10-CM | POA: Diagnosis not present

## 2023-04-17 DIAGNOSIS — O35HXX0 Maternal care for other (suspected) fetal abnormality and damage, fetal lower extremities anomalies, not applicable or unspecified: Secondary | ICD-10-CM | POA: Diagnosis not present

## 2023-04-17 DIAGNOSIS — O283 Abnormal ultrasonic finding on antenatal screening of mother: Secondary | ICD-10-CM | POA: Diagnosis present

## 2023-04-17 DIAGNOSIS — Z3A37 37 weeks gestation of pregnancy: Secondary | ICD-10-CM

## 2023-04-17 DIAGNOSIS — O99213 Obesity complicating pregnancy, third trimester: Secondary | ICD-10-CM | POA: Diagnosis present

## 2023-04-17 DIAGNOSIS — E663 Overweight: Secondary | ICD-10-CM | POA: Diagnosis not present

## 2023-04-17 DIAGNOSIS — O339 Maternal care for disproportion, unspecified: Secondary | ICD-10-CM | POA: Diagnosis present

## 2023-04-17 DIAGNOSIS — Z8249 Family history of ischemic heart disease and other diseases of the circulatory system: Secondary | ICD-10-CM

## 2023-04-17 DIAGNOSIS — O358XX0 Maternal care for other (suspected) fetal abnormality and damage, not applicable or unspecified: Secondary | ICD-10-CM | POA: Diagnosis not present

## 2023-04-17 DIAGNOSIS — O99214 Obesity complicating childbirth: Principal | ICD-10-CM | POA: Diagnosis present

## 2023-04-17 DIAGNOSIS — J45909 Unspecified asthma, uncomplicated: Secondary | ICD-10-CM | POA: Diagnosis not present

## 2023-04-17 DIAGNOSIS — Z8616 Personal history of COVID-19: Secondary | ICD-10-CM | POA: Diagnosis not present

## 2023-04-17 LAB — CBC
HCT: 36.9 % (ref 36.0–46.0)
Hemoglobin: 12.7 g/dL (ref 12.0–15.0)
MCH: 30.8 pg (ref 26.0–34.0)
MCHC: 34.4 g/dL (ref 30.0–36.0)
MCV: 89.3 fL (ref 80.0–100.0)
Platelets: 249 10*3/uL (ref 150–400)
RBC: 4.13 MIL/uL (ref 3.87–5.11)
RDW: 14.2 % (ref 11.5–15.5)
WBC: 11.3 10*3/uL — ABNORMAL HIGH (ref 4.0–10.5)
nRBC: 0 % (ref 0.0–0.2)

## 2023-04-17 LAB — TYPE AND SCREEN
ABO/RH(D): A POS
Antibody Screen: NEGATIVE

## 2023-04-17 LAB — ABO/RH: ABO/RH(D): A POS

## 2023-04-17 LAB — RPR: RPR Ser Ql: NONREACTIVE

## 2023-04-17 MED ORDER — ONDANSETRON HCL 4 MG/2ML IJ SOLN
4.0000 mg | Freq: Four times a day (QID) | INTRAMUSCULAR | Status: DC | PRN
  Administered 2023-04-18: 4 mg via INTRAVENOUS
  Filled 2023-04-17: qty 2

## 2023-04-17 MED ORDER — LIDOCAINE-EPINEPHRINE (PF) 1.5 %-1:200000 IJ SOLN
INTRAMUSCULAR | Status: DC | PRN
  Administered 2023-04-17: 3 mL via PERINEURAL

## 2023-04-17 MED ORDER — MISOPROSTOL 50MCG HALF TABLET
50.0000 ug | ORAL_TABLET | Freq: Once | ORAL | Status: AC
  Administered 2023-04-17: 50 ug via ORAL
  Filled 2023-04-17: qty 1

## 2023-04-17 MED ORDER — FENTANYL-BUPIVACAINE-NACL 0.5-0.125-0.9 MG/250ML-% EP SOLN
EPIDURAL | Status: AC
  Filled 2023-04-17: qty 250

## 2023-04-17 MED ORDER — MISOPROSTOL 200 MCG PO TABS
ORAL_TABLET | ORAL | Status: AC
  Filled 2023-04-17: qty 4

## 2023-04-17 MED ORDER — BUPIVACAINE HCL (PF) 0.25 % IJ SOLN
INTRAMUSCULAR | Status: DC | PRN
  Administered 2023-04-17 (×2): 5 mL via EPIDURAL

## 2023-04-17 MED ORDER — MISOPROSTOL 50MCG HALF TABLET: 50.0000 ug | ORAL_TABLET | Freq: Once | ORAL | Status: DC

## 2023-04-17 MED ORDER — LACTATED RINGERS IV SOLN: 500.0000 mL | Freq: Once | INTRAVENOUS | Status: DC

## 2023-04-17 MED ORDER — MISOPROSTOL 50MCG HALF TABLET
50.0000 ug | ORAL_TABLET | ORAL | Status: DC
  Administered 2023-04-17: 50 ug via VAGINAL

## 2023-04-17 MED ORDER — OXYTOCIN-SODIUM CHLORIDE 30-0.9 UT/500ML-% IV SOLN
1.0000 m[IU]/min | INTRAVENOUS | Status: DC
  Administered 2023-04-17: 2 m[IU]/min via INTRAVENOUS

## 2023-04-17 MED ORDER — LACTATED RINGERS IV SOLN
500.0000 mL | INTRAVENOUS | Status: DC | PRN
  Administered 2023-04-17 (×2): 500 mL via INTRAVENOUS

## 2023-04-17 MED ORDER — PHENYLEPHRINE 80 MCG/ML (10ML) SYRINGE FOR IV PUSH (FOR BLOOD PRESSURE SUPPORT): 80.0000 ug | PREFILLED_SYRINGE | INTRAVENOUS | Status: DC | PRN

## 2023-04-17 MED ORDER — MISOPROSTOL 25 MCG QUARTER TABLET
25.0000 ug | ORAL_TABLET | ORAL | Status: DC
  Administered 2023-04-17: 25 ug via ORAL

## 2023-04-17 MED ORDER — MISOPROSTOL 50MCG HALF TABLET
ORAL_TABLET | ORAL | Status: AC
  Administered 2023-04-17: 50 ug
  Filled 2023-04-17: qty 1

## 2023-04-17 MED ORDER — SOD CITRATE-CITRIC ACID 500-334 MG/5ML PO SOLN: 30.0000 mL | ORAL | Status: DC | PRN

## 2023-04-17 MED ORDER — TERBUTALINE SULFATE 1 MG/ML IJ SOLN: 0.2500 mg | Freq: Once | INTRAMUSCULAR | Status: DC | PRN

## 2023-04-17 MED ORDER — OXYTOCIN-SODIUM CHLORIDE 30-0.9 UT/500ML-% IV SOLN
2.5000 [IU]/h | INTRAVENOUS | Status: DC
  Administered 2023-04-18: 2.5 [IU]/h via INTRAVENOUS
  Filled 2023-04-17 (×2): qty 500

## 2023-04-17 MED ORDER — MISOPROSTOL 25 MCG QUARTER TABLET
ORAL_TABLET | ORAL | Status: AC
  Administered 2023-04-17: 25 ug
  Filled 2023-04-17: qty 1

## 2023-04-17 MED ORDER — AMMONIA AROMATIC IN INHA
RESPIRATORY_TRACT | Status: AC
  Filled 2023-04-17: qty 10

## 2023-04-17 MED ORDER — MISOPROSTOL 50MCG HALF TABLET
ORAL_TABLET | ORAL | Status: AC
  Filled 2023-04-17: qty 1

## 2023-04-17 MED ORDER — OXYTOCIN BOLUS FROM INFUSION: 333.0000 mL | Freq: Once | INTRAVENOUS | Status: DC

## 2023-04-17 MED ORDER — FENTANYL-BUPIVACAINE-NACL 0.5-0.125-0.9 MG/250ML-% EP SOLN
EPIDURAL | Status: DC | PRN
  Administered 2023-04-17: 8 mL/h via EPIDURAL

## 2023-04-17 MED ORDER — LACTATED RINGERS IV SOLN: INTRAVENOUS | Status: DC

## 2023-04-17 MED ORDER — OXYTOCIN 10 UNIT/ML IJ SOLN
INTRAMUSCULAR | Status: AC
  Filled 2023-04-17: qty 2

## 2023-04-17 MED ORDER — LIDOCAINE HCL (PF) 1 % IJ SOLN: 30.0000 mL | INTRAMUSCULAR | Status: DC | PRN

## 2023-04-17 MED ORDER — EPHEDRINE 5 MG/ML INJ: 10.0000 mg | INTRAVENOUS | Status: DC | PRN

## 2023-04-17 MED ORDER — DIPHENHYDRAMINE HCL 50 MG/ML IJ SOLN: 12.5000 mg | INTRAMUSCULAR | Status: DC | PRN

## 2023-04-17 MED ORDER — FENTANYL CITRATE (PF) 100 MCG/2ML IJ SOLN: 50.0000 ug | INTRAMUSCULAR | Status: DC | PRN

## 2023-04-17 MED ORDER — FENTANYL-BUPIVACAINE-NACL 0.5-0.125-0.9 MG/250ML-% EP SOLN: 12.0000 mL/h | EPIDURAL | Status: DC | PRN

## 2023-04-17 MED ORDER — LIDOCAINE HCL (PF) 1 % IJ SOLN
INTRAMUSCULAR | Status: AC
  Filled 2023-04-17: qty 30

## 2023-04-17 MED ORDER — ACETAMINOPHEN 325 MG PO TABS: 650.0000 mg | ORAL_TABLET | ORAL | Status: DC | PRN

## 2023-04-17 MED ORDER — LIDOCAINE HCL (PF) 1 % IJ SOLN
INTRAMUSCULAR | Status: DC | PRN
  Administered 2023-04-17: 3 mL

## 2023-04-17 NOTE — H&P (Signed)
[facility] Labor & Delivery  History and Physical   HPI   Chief Complaint: I'm here for my induction.  Melanie Frederick is a 32 y.o. G1P0000 at [redacted]w[redacted]d who presents for scheduled induction of labor due to BMI>40, fetal shortened femurs with MFM recommendation for delivery at 39w  Endorses fetal movement Patient denies contractions, loss of fluid, vaginal bleeding.   Pregnancy Complications Patient Active Problem List   Diagnosis Date Noted   Labor and delivery, indication for care 04/17/2023   [redacted] weeks gestation of pregnancy 04/08/2023   [redacted] weeks gestation of pregnancy 03/29/2023   Obesity affecting pregnancy in third trimester 02/06/2023   Abnormal fetal ultrasound 02/06/2023   BMI 45.0-49.9, adult (HCC) 10/24/2022   First pregnancy 09/03/2022    Review of Systems A twelve point review of systems was negative except as stated in HPI.   HISTORY   Medications Medications Prior to Admission  Medication Sig Dispense Refill Last Dose   Prenatal Vit-Fe Fumarate-FA (MULTIVITAMIN-PRENATAL) 27-0.8 MG TABS tablet Take 1 tablet by mouth daily at 12 noon.   04/16/2023 at 2200   aspirin EC 81 MG tablet Take 1 tablet (81 mg total) by mouth daily. Take after 12 weeks for prevention of preeclampssia later in pregnancy (Patient not taking: Reported on 04/17/2023) 300 tablet 2 Not Taking   doxylamine, Sleep, (UNISOM) 25 MG tablet Take 10 mg by mouth at bedtime as needed for sleep.   Unknown   hydrOXYzine (VISTARIL) 25 MG capsule Take 1 capsule (25 mg total) by mouth every 6 (six) hours as needed. (Patient not taking: Reported on 04/17/2023) 30 capsule 0 Not Taking   prednisoLONE acetate (PRED FORTE) 1 % ophthalmic suspension  (Patient not taking: Reported on 04/15/2023)   Not Taking    Allergies is allergic to mushroom extract complex and prednisone.   OB History OB History  Gravida Para Term Preterm AB Living  1 0 0 0 0 0  SAB IAB Ectopic Multiple Live Births  0 0 0 0  0    # Outcome Date GA Lbr Len/2nd Weight Sex Delivery Anes PTL Lv  1 Current             Past Medical History Past Medical History:  Diagnosis Date   Anxiety    Asthma 10/24/2022   Fatigue    History of COVID-19 11/24/2020   Diagnosed January 2022   History of COVID-19 11/24/2020   Diagnosed January 2022   Nevus    Vaccine for human papilloma virus (HPV) types 6, 11, 16, and 18 administered     Past Surgical History Past Surgical History:  Procedure Laterality Date   ARM SKIN LESION BIOPSY / EXCISION Left 2012   LYMPH NODE DISSECTION Right 2011   MOLE REMOVAL Left    moval removal off of back    Social History  reports that she has never smoked. She has never used smokeless tobacco. She reports that she does not currently use alcohol. She reports that she does not use drugs.   Family History family history includes Brain cancer in an other family member; Cancer (age of onset: 66) in an other family member; Colon polyps in her mother; Gout in her brother; Heart attack in her paternal grandmother; Hypothyroidism in her paternal grandmother; Migraines in her brother.   PHYSICAL EXAM   Vitals:   04/17/23 0601 04/17/23 0602 04/17/23 0706  BP:  125/88   Pulse:  (!) 111   Temp:  98.5 F (  36.9 C) 98 F (36.7 C)  TempSrc:  Oral Oral  Weight: (!) 151.7 kg (!) 151 kg   Height: 5\' 7"  (1.702 m)      Constitutional: No acute distress, well appearing, and well nourished. Neurologic: She is alert and conversational.  Psychiatric: She has a normal mood and affect.  Musculoskeletal: Normal gait, grossly normal range of motion Cardiovascular: Normal rate.   Pulmonary/Chest: Normal work of breathing.  Gastrointestinal/Abdominal: Soft. Gravid. There is no tenderness. Panace swollen. Skin: Skin is warm and dry. No rash noted.  Genitourinary: Normal external female genitalia.  SVE:  1/70/-3   NST Interpretation Baseline: 150 bpm Variability: moderate Accelerations:  present Decelerations: variable Contractions: irregular, occasional Time noted:  See OBIX Impression: equivocal Authenticated by: Dominica Severin     ASSESSMENT AND PLAN   Melanie Frederick is a 32 y.o. G1P0000 at [redacted]w[redacted]d with EDD: 04/22/2023, by Ultrasound admitted for induction of labor.  Cook catheter placed with inadvertent AROM with inflation of balloon. Uterine balloon deflated & removed. Will proceed with misoprostol for ripening then pitocin titration per protocol.  Fetal Status: - cephalic presentation by SVE & 37w ultrasound - EFW: 86%, 3451g 6/24 at [redacted]w[redacted]d by ultrasound. AC 99% - CEFM - FHT currently cat II  Obesity in pregnancy - Anesthesia consult completed  Fetal shortened femurs - followed by MFM, notify pediatrician at delivery  Labs/Immunizations: TDAP: received prenatally 01/16/23 Flu: n/a Rubella: non-immune Varicella: non-immune Hep B:  Lab Results  Component Value Date   HEPBSAG Negative 09/17/2022   Hep C: NR RPR: NR GBS: negative  Lab Results  Component Value Date   VZVIGG <135 (L) 09/17/2022   HIV Non Reactive 01/16/2023     Postpartum Plan: - Feeding: Breast Milk - Contraception: plans undecided   Attending Dr. Logan Bores was immediately available for the care of the patient.   Dominica Severin, CNM

## 2023-04-17 NOTE — Anesthesia Procedure Notes (Signed)
Epidural Patient location during procedure: OB Start time: 04/17/2023 9:28 PM End time: 04/17/2023 9:36 PM  Staffing Anesthesiologist: Louie Boston, MD Performed: anesthesiologist   Preanesthetic Checklist Completed: patient identified, IV checked, site marked, risks and benefits discussed, surgical consent, monitors and equipment checked, pre-op evaluation and timeout performed  Epidural Patient position: sitting Prep: ChloraPrep Patient monitoring: heart rate, continuous pulse ox and blood pressure Approach: midline Location: L3-L4 Injection technique: LOR saline  Needle:  Needle type: Tuohy  Needle gauge: 17 G Needle length: 9 cm and 9 Needle insertion depth: 10 cm Catheter type: closed end flexible Catheter size: 19 Gauge Catheter at skin depth: 16 cm Test dose: negative and 1.5% lidocaine with Epi 1:200 K  Assessment Sensory level: T10 Events: blood not aspirated, injection not painful, no injection resistance, no paresthesia and negative IV test  Additional Notes 1 attempt Pt. Evaluated and documentation done after procedure finished. Patient identified. Risks/Benefits/Options discussed with patient including but not limited to bleeding, infection, nerve damage, paralysis, failed block, incomplete pain control, headache, blood pressure changes, nausea, vomiting, reactions to medication both or allergic, itching and postpartum back pain. Confirmed with bedside nurse the patient's most recent platelet count. Confirmed with patient that they are not currently taking any anticoagulation, have any bleeding history or any family history of bleeding disorders. Patient expressed understanding and wished to proceed. All questions were answered. Sterile technique was used throughout the entire procedure. Please see nursing notes for vital signs. Test dose was given through epidural catheter and negative prior to continuing to dose epidural or start infusion. Warning signs of high  block given to the patient including shortness of breath, tingling/numbness in hands, complete motor block, or any concerning symptoms with instructions to call for help. Patient was given instructions on fall risk and not to get out of bed. All questions and concerns addressed with instructions to call with any issues or inadequate analgesia.    Patient tolerated the insertion well without immediate complications. Reason for block:procedure for pain

## 2023-04-17 NOTE — Progress Notes (Signed)
Melanie Frederick is a 32 y.o. G1P0000 at [redacted]w[redacted]d  who continues with induction of labor. Monitoring contraction activity is difficult due to her body habitus. Toco belts adjusted and readjusted periodically.  Subjective:  she feels some very midl cramping.   Objective: BP (!) 146/89   Pulse 90   Temp 97.9 F (36.6 C) (Oral)   Resp 16   Ht 5\' 7"  (1.702 m)   Wt (!) 151 kg   LMP 07/08/2022 (Exact Date)   BMI 52.16 kg/m  No intake/output data recorded. No intake/output data recorded.  FHT:  FHR: 145 bpm, variability: moderate,  accelerations:  Present,  decelerations:  Absent UC:   occasional mild cramping SVE:   Dilation: 1.5 Effacement (%): 70 Station: -3 Exam by:: Liana Crocker CNM  Labs: Lab Results  Component Value Date   WBC 11.3 (H) 04/17/2023   HGB 12.7 04/17/2023   HCT 36.9 04/17/2023   MCV 89.3 04/17/2023   PLT 249 04/17/2023    Assessment / Plan: IOL at term due to body habitus, continues with cervical ripening.  Labor: Progressing normally  Fetal Wellbeing:  Category I Pain Control:  Labor support without medications I/D:  n/a Anticipated MOD:  NSVD  Mirna Mires, CNM 04/17/2023, 12:39 PM

## 2023-04-17 NOTE — Progress Notes (Signed)
Melanie Frederick is a 32 y.o. G1P0000 at [redacted]w[redacted]d by ultrasound admitted for induction of labor due to high BMI (52). Her parents are present and supportive. Subjective: she is comfortable. Is not feeling any contractions. Was given cytotec orally about 30 minutes ago. An attempt to place a [facility] Catheter was made and AROM occurred. She continues to leak small amounts of amniotic fluid. She denies any headache today; does have pitting edema in her ankels and feet.   Objective: BP (!) 125/96   Pulse 98   Temp 97.9 F (36.6 C) (Oral)   Ht 5\' 7"  (1.702 m)   Wt (!) 151 kg   LMP 07/08/2022 (Exact Date)   BMI 52.16 kg/m  No intake/output data recorded. No intake/output data recorded.  FHT:  FHR: 145-150 bpm, variability: moderate,  accelerations:  Present,  decelerations:  Absent UC:   none SVE:   Dilation: 1 Effacement (%): 80 Station: -3 Exam by:: J ALbrecht CNM Bishop score is: 4 Labs: Lab Results  Component Value Date   WBC 11.3 (H) 04/17/2023   HGB 12.7 04/17/2023   HCT 36.9 04/17/2023   MCV 89.3 04/17/2023   PLT 249 04/17/2023    Assessment / Plan: Induction of labor due to morbid obesity (BMI 52),   now undergoing cervical ripening with oral cytotec.  Labor:  cervical ripening Fetal Wellbeing:  Category I Pain Control:  Labor support without medications I/D:  n/a Anticipated MOD:  NSVD  Mirna Mires, CNM 04/17/2023, 9:11 AM

## 2023-04-17 NOTE — Anesthesia Preprocedure Evaluation (Signed)
Anesthesia Evaluation  Patient identified by MRN, date of birth, ID band Patient awake    Reviewed: Allergy & Precautions, NPO status , Patient's Chart, lab work & pertinent test results  History of Anesthesia Complications Negative for: history of anesthetic complications  Airway Mallampati: III  TM Distance: >3 FB Neck ROM: full    Dental no notable dental hx.    Pulmonary neg pulmonary ROS   Pulmonary exam normal        Cardiovascular Exercise Tolerance: Good negative cardio ROS Normal cardiovascular exam     Neuro/Psych  PSYCHIATRIC DISORDERS Anxiety        GI/Hepatic negative GI ROS,,,  Endo/Other    Morbid obesity  Renal/GU   negative genitourinary   Musculoskeletal   Abdominal  (+) + obese  Peds  Hematology negative hematology ROS (+)   Anesthesia Other Findings Pt with epidural but failure deliver. Will proceed with epidural anesthesia.  Past Medical History: No date: Anxiety 10/24/2022: Asthma- remote, short duration No date: Fatigue 11/24/2020: History of COVID-19     Comment:  Diagnosed January 2022 11/24/2020: History of COVID-19     Comment:  Diagnosed January 2022 No date: Nevus No date: Vaccine for human papilloma virus (HPV) types 6, 11, 16, and  18 administered  Past Surgical History: 2012: ARM SKIN LESION BIOPSY / EXCISION; Left 2011: LYMPH NODE DISSECTION; Right No date: MOLE REMOVAL; Left     Comment:  moval removal off of back  BMI    Body Mass Index: 52.16 kg/m      Reproductive/Obstetrics (+) Pregnancy                              Anesthesia Physical Anesthesia Plan  ASA: 3  Anesthesia Plan: Epidural   Post-op Pain Management:    Induction:   PONV Risk Score and Plan:   Airway Management Planned:   Additional Equipment:   Intra-op Plan:   Post-operative Plan:   Informed Consent: I have reviewed the patients History and Physical,  chart, labs and discussed the procedure including the risks, benefits and alternatives for the proposed anesthesia with the patient or authorized representative who has indicated his/her understanding and acceptance.       Plan Discussed with: Anesthesiologist  Anesthesia Plan Comments:        Anesthesia Quick Evaluation

## 2023-04-17 NOTE — Progress Notes (Signed)
Melanie Frederick is a 32 y.o. G1P0000 who continues with induction of labor.   Subjective:  She is starting to feel that her contractions are painful. She would like to get an epidural.  Objective: BP (!) 117/56   Pulse 93   Temp 98.1 F (36.7 C) (Oral)   Resp 18   Ht 5\' 7"  (1.702 m)   Wt (!) 151 kg   LMP 07/08/2022 (Exact Date)   BMI 52.16 kg/m  No intake/output data recorded. No intake/output data recorded.  FHT:  FHR: 150 bpm, variability: minimal to moderate,  accelerations:  Present,  decelerations:  Absent UC:   regular, every 1.6-2 minutes SVE:   Dilation: 3 Effacement (%): 80 Station: -2 Exam by:: Liana Crocker CNM Vertex feels ? asynclitic Pitocin at 45mu/min Labs: Lab Results  Component Value Date   WBC 11.3 (H) 04/17/2023   HGB 12.7 04/17/2023   HCT 36.9 04/17/2023   MCV 89.3 04/17/2023   PLT 249 04/17/2023    Assessment / Plan: Induction of labor due to high BMI,  making some progress with pitocin infusion. Cervical sweep performed by this provider ROM since this AM. Anesthesia notified for epidural. Will recheck once comfortable. May consider IUPC placement once she is comfortable.  Labor:  will hold on advancing pitocin till after epidural is placed as UC frequency is higher  Fetal Wellbeing:  Category II Pain Control:  Labor support without medications I/D:  n/a Anticipated MOD:  NSVD  Mirna Mires, CNM 04/17/2023, 9:06 PM

## 2023-04-17 NOTE — Progress Notes (Signed)
Upon adjusting FHR [place], audible and palpable movement by fetus noted. Broken strip due to continuous fetal movement. Will continue to monitor.

## 2023-04-18 ENCOUNTER — Encounter
Admission: EM | Disposition: A | Discharge status: Other Acute Inpatient Hospital | Payer: Self-pay | Source: Home / Self Care | Attending: Obstetrics

## 2023-04-18 ENCOUNTER — Encounter: Payer: Self-pay | Admitting: Obstetrics and Gynecology

## 2023-04-18 DIAGNOSIS — Z3A39 39 weeks gestation of pregnancy: Secondary | ICD-10-CM

## 2023-04-18 DIAGNOSIS — E663 Overweight: Secondary | ICD-10-CM

## 2023-04-18 DIAGNOSIS — O99214 Obesity complicating childbirth: Secondary | ICD-10-CM

## 2023-04-18 DIAGNOSIS — O358XX0 Maternal care for other (suspected) fetal abnormality and damage, not applicable or unspecified: Secondary | ICD-10-CM

## 2023-04-18 LAB — PROTEIN / CREATININE RATIO, URINE
Creatinine, Urine: 64 mg/dL
Protein Creatinine Ratio: 0.22 mg/mg{Cre} — ABNORMAL HIGH (ref 0.00–0.15)
Total Protein, Urine: 14 mg/dL

## 2023-04-18 SURGERY — Surgical Case
Anesthesia: Epidural

## 2023-04-18 MED ORDER — LIDOCAINE 2% (20 MG/ML) 5 ML SYRINGE: INTRAMUSCULAR | Status: DC | PRN

## 2023-04-18 MED ORDER — SODIUM CHLORIDE 0.9% FLUSH: 3.0000 mL | INTRAVENOUS | Status: DC | PRN

## 2023-04-18 MED ORDER — KETOROLAC TROMETHAMINE 30 MG/ML IJ SOLN
30.0000 mg | Freq: Four times a day (QID) | INTRAMUSCULAR | Status: DC | PRN
  Administered 2023-04-18: 30 mg via INTRAVENOUS
  Filled 2023-04-18: qty 1

## 2023-04-18 MED ORDER — SENNOSIDES-DOCUSATE SODIUM 8.6-50 MG PO TABS
2.0000 | ORAL_TABLET | ORAL | Status: DC
  Administered 2023-04-18: 2 via ORAL
  Filled 2023-04-18: qty 2

## 2023-04-18 MED ORDER — PRENATAL MULTIVITAMIN CH: 1.0000 | ORAL_TABLET | Freq: Every day | ORAL | Status: DC

## 2023-04-18 MED ORDER — SIMETHICONE 80 MG PO CHEW
80.0000 mg | CHEWABLE_TABLET | Freq: Four times a day (QID) | ORAL | Status: DC
  Administered 2023-04-18 (×2): 80 mg via ORAL
  Filled 2023-04-18 (×2): qty 1

## 2023-04-18 MED ORDER — ONDANSETRON HCL 4 MG/2ML IJ SOLN
INTRAMUSCULAR | Status: DC | PRN
  Administered 2023-04-18: 4 mg via INTRAVENOUS

## 2023-04-18 MED ORDER — DEXAMETHASONE SODIUM PHOSPHATE 10 MG/ML IJ SOLN
INTRAMUSCULAR | Status: DC | PRN
  Administered 2023-04-18: 8 mg via INTRAVENOUS

## 2023-04-18 MED ORDER — 0.9 % SODIUM CHLORIDE (POUR BTL) OPTIME
TOPICAL | Status: DC | PRN
  Administered 2023-04-18: 100 mL

## 2023-04-18 MED ORDER — CHLORHEXIDINE GLUCONATE 0.12 % MT SOLN
OROMUCOSAL | Status: AC
  Administered 2023-04-18: 15 mL
  Filled 2023-04-18: qty 15

## 2023-04-18 MED ORDER — NALOXONE HCL 0.4 MG/ML IJ SOLN: 0.4000 mg | INTRAMUSCULAR | Status: DC | PRN

## 2023-04-18 MED ORDER — MENTHOL 3 MG MT LOZG: 1.0000 | LOZENGE | OROMUCOSAL | Status: DC | PRN

## 2023-04-18 MED ORDER — OXYCODONE-ACETAMINOPHEN 5-325 MG PO TABS: 1.0000 | ORAL_TABLET | ORAL | Status: DC | PRN

## 2023-04-18 MED ORDER — MORPHINE SULFATE (PF) 0.5 MG/ML IJ SOLN
INTRAMUSCULAR | Status: DC | PRN
  Administered 2023-04-18: 3 mg via EPIDURAL

## 2023-04-18 MED ORDER — SCOPOLAMINE 1 MG/3DAYS TD PT72
1.0000 | MEDICATED_PATCH | Freq: Once | TRANSDERMAL | Status: DC
  Administered 2023-04-18: 1.5 mg via TRANSDERMAL
  Filled 2023-04-18: qty 1

## 2023-04-18 MED ORDER — CEFAZOLIN IN SODIUM CHLORIDE 3-0.9 GM/100ML-% IV SOLN
3.0000 g | INTRAVENOUS | Status: AC
  Administered 2023-04-18: 3 g via INTRAVENOUS
  Filled 2023-04-18: qty 100

## 2023-04-18 MED ORDER — LACTATED RINGERS IV SOLN: INTRAVENOUS | Status: DC

## 2023-04-18 MED ORDER — LIDOCAINE HCL (PF) 2 % IJ SOLN
INTRAMUSCULAR | Status: DC | PRN
  Administered 2023-04-18 (×3): 5 mL via EPIDURAL

## 2023-04-18 MED ORDER — OXYCODONE HCL 5 MG PO TABS: 5.0000 mg | ORAL_TABLET | Freq: Four times a day (QID) | ORAL | Status: DC | PRN

## 2023-04-18 MED ORDER — SOD CITRATE-CITRIC ACID 500-334 MG/5ML PO SOLN
ORAL | Status: AC
  Administered 2023-04-18: 30 mL via ORAL
  Filled 2023-04-18: qty 15

## 2023-04-18 MED ORDER — KETOROLAC TROMETHAMINE 30 MG/ML IJ SOLN: 30.0000 mg | Freq: Four times a day (QID) | INTRAMUSCULAR | Status: DC | PRN

## 2023-04-18 MED ORDER — KETOROLAC TROMETHAMINE 30 MG/ML IJ SOLN
INTRAMUSCULAR | Status: DC | PRN
  Administered 2023-04-18: 30 mg via INTRAVENOUS

## 2023-04-18 MED ORDER — DEXAMETHASONE SODIUM PHOSPHATE 10 MG/ML IJ SOLN
INTRAMUSCULAR | Status: AC
  Filled 2023-04-18: qty 2

## 2023-04-18 MED ORDER — IBUPROFEN 600 MG PO TABS
600.0000 mg | ORAL_TABLET | Freq: Four times a day (QID) | ORAL | Status: DC
  Administered 2023-04-19 (×2): 600 mg via ORAL
  Filled 2023-04-18 (×3): qty 1

## 2023-04-18 MED ORDER — DIPHENHYDRAMINE HCL 25 MG PO CAPS
25.0000 mg | ORAL_CAPSULE | ORAL | Status: DC | PRN
  Filled 2023-04-18: qty 1

## 2023-04-18 MED ORDER — ACETAMINOPHEN 500 MG PO TABS
1000.0000 mg | ORAL_TABLET | Freq: Four times a day (QID) | ORAL | Status: DC
  Administered 2023-04-18 – 2023-04-19 (×3): 1000 mg via ORAL
  Filled 2023-04-18 (×4): qty 2

## 2023-04-18 MED ORDER — SODIUM CHLORIDE 0.9 % IV SOLN
500.0000 mg | Freq: Once | INTRAVENOUS | Status: AC
  Administered 2023-04-18: 500 mg via INTRAVENOUS
  Filled 2023-04-18: qty 5

## 2023-04-18 MED ORDER — MORPHINE SULFATE (PF) 0.5 MG/ML IJ SOLN
INTRAMUSCULAR | Status: AC
  Filled 2023-04-18: qty 10

## 2023-04-18 MED ORDER — NALOXONE HCL 4 MG/10ML IJ SOLN: 1.0000 ug/kg/h | INTRAVENOUS | Status: DC | PRN

## 2023-04-18 MED ORDER — DIPHENHYDRAMINE HCL 50 MG/ML IJ SOLN: 12.5000 mg | INTRAMUSCULAR | Status: DC | PRN

## 2023-04-18 MED ORDER — CEFAZOLIN SODIUM-DEXTROSE 2-4 GM/100ML-% IV SOLN
2.0000 g | Freq: Once | INTRAVENOUS | Status: DC
  Filled 2023-04-18: qty 100

## 2023-04-18 MED ORDER — ONDANSETRON HCL 4 MG/2ML IJ SOLN: 4.0000 mg | Freq: Three times a day (TID) | INTRAMUSCULAR | Status: DC | PRN

## 2023-04-18 MED ORDER — LIDOCAINE 5 % EX PTCH
MEDICATED_PATCH | CUTANEOUS | Status: AC
  Filled 2023-04-18: qty 1

## 2023-04-18 MED ORDER — KETOROLAC TROMETHAMINE 30 MG/ML IJ SOLN
INTRAMUSCULAR | Status: AC
  Filled 2023-04-18: qty 1

## 2023-04-18 MED ORDER — OXYTOCIN 10 UNIT/ML IJ SOLN
INTRAMUSCULAR | Status: AC
  Filled 2023-04-18: qty 3

## 2023-04-18 MED ORDER — SOD CITRATE-CITRIC ACID 500-334 MG/5ML PO SOLN: 30.0000 mL | ORAL | Status: AC

## 2023-04-18 MED ORDER — LACTATED RINGERS IV SOLN: INTRAVENOUS | Status: DC | PRN

## 2023-04-18 MED ORDER — OXYTOCIN-SODIUM CHLORIDE 30-0.9 UT/500ML-% IV SOLN
INTRAVENOUS | Status: DC | PRN
  Administered 2023-04-18 (×2): 30 [IU] via INTRAVENOUS

## 2023-04-18 MED ORDER — ZOLPIDEM TARTRATE 5 MG PO TABS: 5.0000 mg | ORAL_TABLET | Freq: Every evening | ORAL | Status: DC | PRN

## 2023-04-18 MED ORDER — DIPHENHYDRAMINE HCL 25 MG PO CAPS
25.0000 mg | ORAL_CAPSULE | Freq: Four times a day (QID) | ORAL | Status: DC | PRN
  Administered 2023-04-19 (×2): 25 mg via ORAL
  Filled 2023-04-18: qty 1

## 2023-04-18 MED ORDER — FENTANYL CITRATE (PF) 100 MCG/2ML IJ SOLN
INTRAMUSCULAR | Status: AC
  Filled 2023-04-18: qty 2

## 2023-04-18 MED ORDER — PHENYLEPHRINE 80 MCG/ML (10ML) SYRINGE FOR IV PUSH (FOR BLOOD PRESSURE SUPPORT)
PREFILLED_SYRINGE | INTRAVENOUS | Status: AC
  Filled 2023-04-18: qty 10

## 2023-04-18 MED ORDER — FENTANYL CITRATE (PF) 100 MCG/2ML IJ SOLN
INTRAMUSCULAR | Status: DC | PRN
  Administered 2023-04-18: 100 ug via INTRATHECAL

## 2023-04-18 MED ORDER — OXYTOCIN-SODIUM CHLORIDE 30-0.9 UT/500ML-% IV SOLN: 2.5000 [IU]/h | INTRAVENOUS | Status: DC

## 2023-04-18 MED ORDER — LIDOCAINE 5 % EX PTCH
MEDICATED_PATCH | CUTANEOUS | Status: DC | PRN
  Administered 2023-04-18: 1 via TRANSDERMAL

## 2023-04-18 MED ORDER — LIDOCAINE HCL (PF) 2 % IJ SOLN
INTRAMUSCULAR | Status: AC
  Filled 2023-04-18: qty 5

## 2023-04-18 MED ORDER — ACETAMINOPHEN 500 MG PO TABS
ORAL_TABLET | ORAL | Status: AC
  Filled 2023-04-18: qty 2

## 2023-04-18 SURGICAL SUPPLY — 34 items
ADH LQ OCL WTPRF AMP STRL LF (MISCELLANEOUS) ×1
ADHESIVE MASTISOL STRL (MISCELLANEOUS) ×1 IMPLANT
APL PRP STRL LF DISP 70% ISPRP (MISCELLANEOUS) ×2
BAG COUNTER SPONGE SURGICOUNT (BAG) ×1 IMPLANT
BAG SPNG CNTER NS LX DISP (BAG) ×1
BNDG TENSOPLAST 6X5 (GAUZE/BANDAGES/DRESSINGS) IMPLANT
CHLORAPREP W/TINT 26 (MISCELLANEOUS) ×2 IMPLANT
DRSG CURAFIL 4X4 STRL (GAUZE/BANDAGES/DRESSINGS) IMPLANT
DRSG TELFA 3X8 NADH (GAUZE/BANDAGES/DRESSINGS) ×1 IMPLANT
DRSG TELFA 3X8 NADH STRL (GAUZE/BANDAGES/DRESSINGS) ×1 IMPLANT
GAUZE CURAFIL 4X4 (GAUZE/BANDAGES/DRESSINGS) IMPLANT
GAUZE SPONGE 4X4 12PLY STRL (GAUZE/BANDAGES/DRESSINGS) ×1 IMPLANT
GLOVE PI ORTHO PRO STRL 7.5 (GLOVE) ×1 IMPLANT
GOWN STRL REUS W/ TWL LRG LVL3 (GOWN DISPOSABLE) ×2 IMPLANT
GOWN STRL REUS W/TWL LRG LVL3 (GOWN DISPOSABLE) ×2
KIT TURNOVER KIT A (KITS) ×1 IMPLANT
MANIFOLD NEPTUNE II (INSTRUMENTS) ×1 IMPLANT
MAT PREVALON FULL STRYKER (MISCELLANEOUS) ×1 IMPLANT
NS IRRIG 1000ML POUR BTL (IV SOLUTION) ×1 IMPLANT
PACK C SECTION AR (MISCELLANEOUS) ×1 IMPLANT
PAD DRESSING TELFA 3X8 NADH (GAUZE/BANDAGES/DRESSINGS) IMPLANT
PAD OB MATERNITY 4.3X12.25 (PERSONAL CARE ITEMS) ×1 IMPLANT
PAD PREP OB/GYN DISP 24X41 (PERSONAL CARE ITEMS) ×1 IMPLANT
RETRACTOR WND ALEXIS-O 25 LRG (MISCELLANEOUS) ×1 IMPLANT
RETRACTOR WOUND ALXS 25CM LRG (MISCELLANEOUS) ×1 IMPLANT
RTRCTR WOUND ALEXIS O 25CM LRG (MISCELLANEOUS) ×1
SCRUB CHG 4% DYNA-HEX 4OZ (MISCELLANEOUS) ×1 IMPLANT
SPONGE T-LAP 18X18 ~~LOC~~+RFID (SPONGE) ×1 IMPLANT
SUT VIC AB 0 CTX 36 (SUTURE) ×2
SUT VIC AB 0 CTX36XBRD ANBCTRL (SUTURE) ×2 IMPLANT
SUT VIC AB 1 CT1 36 (SUTURE) ×2 IMPLANT
SUT VICRYL+ 3-0 36IN CT-1 (SUTURE) ×2 IMPLANT
TRAP FLUID SMOKE EVACUATOR (MISCELLANEOUS) ×1 IMPLANT
WATER STERILE IRR 500ML POUR (IV SOLUTION) ×1 IMPLANT

## 2023-04-18 NOTE — Op Note (Signed)
     OP NOTE  Date: 04/18/2023   10:49 AM Name Melanie Frederick MR# [id number]  Preoperative Diagnosis: 1. Intrauterine pregnancy at [redacted]w[redacted]d Principal Problem:   Labor and delivery, indication for care Active Problems:   Obesity affecting pregnancy in third trimester   Abnormal fetal ultrasound  2.  cephalo-pelvic disproportion - second stage arrest  Postoperative Diagnosis: 1. Intrauterine pregnancy at [redacted]w[redacted]d, delivered 2. Viable infant 3. Remainder same as pre-op   Procedure: 1. Primary Low-Transverse Cesarean Section  Surgeon: Elonda Husky, MD  Assistant:  Cheree Ditto, CRNFA   No other capable assistant was available for this surgery which requires an experienced, high level assistant.   Anesthesia:  Epidural   EBL: 750 ml     Findings: 1) female infant, Apgar scores of    at 1 minute and    at 5 minutes and a birthweight of   ounces.    2) Normal uterus, tubes and ovaries.    Procedure:  The patient was prepped and draped in the supine position and placed under spinal anesthesia.  A transverse incision was made across the abdomen in a Pfannenstiel manner. If indicated the old scar was systematically removed with sharp dissection.  We carried the dissection down to the level of the fascia.  The fascia was incised in a curvilinear manner.  The fascia was then elevated from the rectus muscles with blunt and sharp dissection.  The rectus muscles were separated laterally exposing the peritoneum.  The peritoneum was carefully entered with care being taken to avoid bowel and bladder.  A self-retaining retractor was placed.  The visceral peritoneum was incised in a curvilinear fashion across the lower uterine segment creating a bladder flap. The bladder was dropped well into the pelvis. A transverse incision was made across the lower uterine segment and extended laterally and superiorly with blunt dissection.  The infant was noted to be wedged deep within the pelvis.  I was  initially unable to deliver the fetal vertex.  An extension of the hysterotomy incision was made with bandage scissors with care not to extend into the adnexa.  With pressure from below and after 4.5 minutes, the infant was delivered from the OP position. The cord was doubly clamped and cut. The infant was handed to the pediatric personnel who then placed the infant under heat lamps where it was cleaned dried and suctioned as needed. The placenta was delivered and noted intact. The hysterotomy incision was then identified on ring forceps.  The extension was not into the adnexa. The uterine cavity was cleaned with a moist lap sponge.  The hysterotomy incision was closed with a running interlocking suture of Vicryl.  Hemostasis was excellent.  Pitocin was run in the IV and the uterus was found to be firm. The posterior cul-de-sac and gutters were cleaned and inspected.  Hemostasis was noted.  The fascia was then closed with a running suture of #1 Vicryl.  Hemostasis of the subcutaneous tissues was obtained using the Bovie.  The subcutaneous tissues were closed with a running suture of 000 Vicryl.  A subcuticular suture was placed.  Steri-strips were applied in the usual manner.  A Lidoderm patch was applied.  A pressure dressing was placed.  The patient went to the recovery room in stable condition. [facility] CRNFA,  provided exposure, dissection, suctioning, retraction, and general support and assistance during the procedure.   Elonda Husky, M.D. 04/18/2023 10:49 AM

## 2023-04-18 NOTE — Progress Notes (Signed)
Pt has been pushing for 3 hours, there has been little no descent. Reviewed with Dr Logan Bores.  Recommend c-section. Pt in agreement. All staff notified Jannifer Hick   [facility]  04/18/2023 10:20 AM

## 2023-04-18 NOTE — Progress Notes (Signed)
Nurse at bedside pushing with patient from [phone] to 0945. Multiple position changes and different pushing techniques used, including pushing on both sides, tug-a-war, squat bar in a squat position and with legs proped on bar pulling, closed knees and traditional. Patient taking a break at 0945.

## 2023-04-18 NOTE — Progress Notes (Signed)
Melanie Frederick is a 32 y.o. G1P0000 at [redacted]w[redacted]d by with ongoing IOL  Objective: BP (!) 148/83   Pulse 79   Temp 98.1 F (36.7 C) (Oral)   Resp 18   Ht 5\' 7"  (1.702 m)   Wt (!) 151 kg   LMP 07/08/2022 (Exact Date)   SpO2 96%   BMI 52.16 kg/m  No intake/output data recorded. No intake/output data recorded.  FHT:  FHR: 125 bpm, variability: moderate,  accelerations:  Present,  decelerations:  Absent UC:   regular, every 2-3 minutes SVE:   Dilation: 3.5 Effacement (%): 100 Station: -1 Exam by:: D Cox RN  Labs: Lab Results  Component Value Date   WBC 11.3 (H) 04/17/2023   HGB 12.7 04/17/2023   HCT 36.9 04/17/2023   MCV 89.3 04/17/2023   PLT 249 04/17/2023    Assessment / Plan: Induction of labor due to [facility] medical conditions,  progressing well on pitocin  Labor: Progressing normally Preeclampsia:   Fetal Wellbeing:  Category I Pain Control:  Epidural I/D:  n/a Anticipated MOD:  NSVD  Mirna Mires, CNM 04/18/2023, 12:58 AM

## 2023-04-18 NOTE — Plan of Care (Signed)
Patient Delivered via C/S

## 2023-04-18 NOTE — Progress Notes (Signed)
   Subjective:  Comfortable with epidural   Objective:   Vitals: Blood pressure 134/88, pulse 89, temperature 98.3 F (36.8 C), temperature source Oral, resp. rate 18, height 5\' 7"  (1.702 m), weight (!) 151 kg, last menstrual period 07/08/2022, SpO2 98%. General: NAD Abdomen: Cervical Exam:  Dilation: 10 Dilation Complete Date: 04/18/23 Dilation Complete Time: 0608 Effacement (%): 100 Cervical Position: Middle Station: -1 Presentation: Vertex Exam by:: Liana Crocker CNM  FHT: baseline 145, moderate variability, pos accel, neg decel  Toco:q 2-3 Pitocin at 14 milli-units   No results found for this or any previous visit (from the past 24 hour(s)).  Assessment:   32 y.o. G1P0000 [redacted]w[redacted]d admitted for IOL secondary to high BMI   Plan:   1) Labor -Has been complete since around 0600, started pushing at 0715, exam at 0830 and 0900 head at 0 station with caput at +1  2) Fetus - category I tracing   3) GBS negative, AROM for clear at 2035 7/10  4) Pain management: epidural managed by Anesthesia   Carie Caddy, CNM   [facility]  04/18/2023 9:05 AM

## 2023-04-18 NOTE — Progress Notes (Signed)
Melanie Frederick is a 32 y.o. G1P0000 at [redacted]w[redacted]d continuing with induction of labor under pitocin  received an epidural many hours ago and has rested.   Subjective: she cannot feel her contractions. Denies any rectal pressure. Very comfortable and has been able to sleep.   Objective: BP (!) 148/83   Pulse 79   Temp 97.9 F (36.6 C) (Axillary)   Resp 18   Ht 5\' 7"  (1.702 m)   Wt (!) 151 kg   LMP 07/08/2022 (Exact Date)   SpO2 96%   BMI 52.16 kg/m  No intake/output data recorded. Total I/O In: -  Out: 1725 [Urine:1725]  FHT:  FHR: 135 baseline bpm, variability: minimal to moderate variability,  accelerations:  Present,  decelerations:  Absent and   UC:   regular, every 1.5-2 minutes SVE:   Dilation: 10 Effacement (%): 100 Station: -1 Exam by:: Liana Crocker CNM Pitocin is running at 14 mu/min. Labs: Lab Results  Component Value Date   WBC 11.3 (H) 04/17/2023   HGB 12.7 04/17/2023   HCT 36.9 04/17/2023   MCV 89.3 04/17/2023   PLT 249 04/17/2023    Assessment / Plan: Induction of labor due to High BMI,   now completely dilated Epidural  Tachycystole with pitocin infusion rate.  Pitocin dropped to 57mu/min Epidural infusion rate also decreased in preparation for pushing    Fetal Wellbeing:  Category I Pain Control:  Epidural I/D:  n/a Anticipated MOD:  NSVD  Will commence pushing in 30-40 minutes once she has some sensation.  Mirna Mires, CNM 04/18/2023, 6:12 AM

## 2023-04-18 NOTE — Progress Notes (Signed)
Reviewed pushing progress with Dr Logan Bores. Will. Continue to push until 1000 and reevaluate for possible c-section.  Carie Caddy, CNM   [facility]  04/18/2023 9:31 AM

## 2023-04-18 NOTE — Transfer of Care (Signed)
Immediate Anesthesia Transfer of Care Note  Patient: Melanie Frederick  Procedure(s) Performed: CESAREAN SECTION  Patient Location: PACU  Anesthesia Type:epidural  Level of Consciousness: awake, alert , and oriented  Airway & Oxygen Therapy: Patient Spontanous Breathing  Post-op Assessment: Report given to RN and Post -op Vital signs reviewed and stable  Post vital signs: Reviewed and stable  Last Vitals:  Vitals Value Taken Time  BP 124/78   Temp    Pulse 80 04/18/23 1236  Resp 22 04/18/23 1236  SpO2 99 % 04/18/23 1236  Vitals shown include unfiled device data.  Last Pain:  Vitals:   04/18/23 0915  TempSrc: Oral  PainSc:       Patients Stated Pain Goal: 0 (04/17/23 1236)  Complications: No notable events documented.

## 2023-04-19 ENCOUNTER — Encounter: Payer: Self-pay | Admitting: Obstetrics and Gynecology

## 2023-04-19 ENCOUNTER — Other Ambulatory Visit: Payer: Self-pay

## 2023-04-19 ENCOUNTER — Observation Stay (HOSPITAL_COMMUNITY)
Admission: AD | Admit: 2023-04-19 | Discharge: 2023-04-21 | Disposition: A | Discharge status: Home / Self Care | Payer: 59 | Source: Ambulatory Visit | Attending: Obstetrics and Gynecology | Admitting: Obstetrics and Gynecology

## 2023-04-19 DIAGNOSIS — O135 Gestational [pregnancy-induced] hypertension without significant proteinuria, complicating the puerperium: Principal | ICD-10-CM | POA: Diagnosis present

## 2023-04-19 DIAGNOSIS — J45909 Unspecified asthma, uncomplicated: Secondary | ICD-10-CM | POA: Insufficient documentation

## 2023-04-19 DIAGNOSIS — Z98891 History of uterine scar from previous surgery: Secondary | ICD-10-CM | POA: Diagnosis not present

## 2023-04-19 DIAGNOSIS — Z8616 Personal history of COVID-19: Secondary | ICD-10-CM

## 2023-04-19 DIAGNOSIS — O99893 Other specified diseases and conditions complicating puerperium: Secondary | ICD-10-CM | POA: Diagnosis present

## 2023-04-19 LAB — CBC
HCT: 30.3 % — ABNORMAL LOW (ref 36.0–46.0)
Hemoglobin: 10.3 g/dL — ABNORMAL LOW (ref 12.0–15.0)
MCH: 30.9 pg (ref 26.0–34.0)
MCHC: 34 g/dL (ref 30.0–36.0)
MCV: 91 fL (ref 80.0–100.0)
Platelets: 219 10*3/uL (ref 150–400)
RBC: 3.33 MIL/uL — ABNORMAL LOW (ref 3.87–5.11)
RDW: 14.2 % (ref 11.5–15.5)
WBC: 17.4 10*3/uL — ABNORMAL HIGH (ref 4.0–10.5)
nRBC: 0 % (ref 0.0–0.2)

## 2023-04-19 MED ORDER — COCONUT OIL OIL: 1.0000 | TOPICAL_OIL | Status: DC | PRN

## 2023-04-19 MED ORDER — IBUPROFEN 600 MG PO TABS
600.0000 mg | ORAL_TABLET | Freq: Four times a day (QID) | ORAL | Status: DC
  Administered 2023-04-19 – 2023-04-21 (×8): 600 mg via ORAL
  Filled 2023-04-19 (×8): qty 1

## 2023-04-19 MED ORDER — ENOXAPARIN SODIUM 80 MG/0.8ML IJ SOSY
80.0000 mg | PREFILLED_SYRINGE | INTRAMUSCULAR | Status: DC
  Administered 2023-04-20 – 2023-04-21 (×2): 80 mg via SUBCUTANEOUS
  Filled 2023-04-19 (×2): qty 0.8

## 2023-04-19 MED ORDER — SIMETHICONE 80 MG PO CHEW: 80.0000 mg | CHEWABLE_TABLET | ORAL | Status: DC | PRN

## 2023-04-19 MED ORDER — ENOXAPARIN SODIUM 80 MG/0.8ML IJ SOSY
0.5000 mg/kg | PREFILLED_SYRINGE | INTRAMUSCULAR | Status: DC
  Administered 2023-04-19: 75 mg via SUBCUTANEOUS
  Filled 2023-04-19: qty 0.75

## 2023-04-19 MED ORDER — KETOROLAC TROMETHAMINE 30 MG/ML IJ SOLN: 30.0000 mg | Freq: Four times a day (QID) | INTRAMUSCULAR | Status: DC

## 2023-04-19 MED ORDER — HYDROMORPHONE HCL 1 MG/ML IJ SOLN: 0.2000 mg | INTRAMUSCULAR | Status: DC | PRN

## 2023-04-19 MED ORDER — OXYTOCIN-SODIUM CHLORIDE 30-0.9 UT/500ML-% IV SOLN: 2.5000 [IU]/h | INTRAVENOUS | Status: DC

## 2023-04-19 MED ORDER — DIBUCAINE (PERIANAL) 1 % EX OINT: 1.0000 | TOPICAL_OINTMENT | CUTANEOUS | Status: DC | PRN

## 2023-04-19 MED ORDER — ZOLPIDEM TARTRATE 5 MG PO TABS: 5.0000 mg | ORAL_TABLET | Freq: Every evening | ORAL | Status: DC | PRN

## 2023-04-19 MED ORDER — ACETAMINOPHEN 500 MG PO TABS
1000.0000 mg | ORAL_TABLET | Freq: Four times a day (QID) | ORAL | Status: DC
  Administered 2023-04-19 – 2023-04-21 (×8): 1000 mg via ORAL
  Filled 2023-04-19 (×8): qty 2

## 2023-04-19 MED ORDER — WITCH HAZEL-GLYCERIN EX PADS: 1.0000 | MEDICATED_PAD | CUTANEOUS | Status: DC | PRN

## 2023-04-19 MED ORDER — NALBUPHINE HCL 10 MG/ML IJ SOLN
5.0000 mg | Freq: Once | INTRAMUSCULAR | Status: AC
  Administered 2023-04-19: 5 mg via SUBCUTANEOUS
  Filled 2023-04-19: qty 1

## 2023-04-19 MED ORDER — DIPHENHYDRAMINE HCL 25 MG PO CAPS: 25.0000 mg | ORAL_CAPSULE | Freq: Four times a day (QID) | ORAL | Status: DC | PRN

## 2023-04-19 MED ORDER — OXYCODONE HCL 5 MG PO TABS: 5.0000 mg | ORAL_TABLET | ORAL | Status: DC | PRN

## 2023-04-19 MED ORDER — SIMETHICONE 80 MG PO CHEW
80.0000 mg | CHEWABLE_TABLET | Freq: Three times a day (TID) | ORAL | Status: DC
  Administered 2023-04-19 – 2023-04-21 (×6): 80 mg via ORAL
  Filled 2023-04-19 (×6): qty 1

## 2023-04-19 MED ORDER — PRENATAL MULTIVITAMIN CH
1.0000 | ORAL_TABLET | Freq: Every day | ORAL | Status: DC
  Administered 2023-04-19 – 2023-04-21 (×3): 1 via ORAL
  Filled 2023-04-19 (×2): qty 1

## 2023-04-19 MED ORDER — GABAPENTIN 100 MG PO CAPS
100.0000 mg | ORAL_CAPSULE | Freq: Two times a day (BID) | ORAL | Status: DC
  Administered 2023-04-19 – 2023-04-21 (×5): 100 mg via ORAL
  Filled 2023-04-19 (×5): qty 1

## 2023-04-19 MED ORDER — MENTHOL 3 MG MT LOZG: 1.0000 | LOZENGE | OROMUCOSAL | Status: DC | PRN

## 2023-04-19 MED ORDER — FUROSEMIDE 20 MG PO TABS
20.0000 mg | ORAL_TABLET | Freq: Every day | ORAL | Status: DC
  Administered 2023-04-19: 20 mg via ORAL
  Filled 2023-04-19: qty 1

## 2023-04-19 MED ORDER — SENNOSIDES-DOCUSATE SODIUM 8.6-50 MG PO TABS
2.0000 | ORAL_TABLET | Freq: Every day | ORAL | Status: DC
  Administered 2023-04-20 – 2023-04-21 (×2): 2 via ORAL
  Filled 2023-04-19 (×2): qty 2

## 2023-04-19 MED ORDER — IBUPROFEN 600 MG PO TABS: 600.0000 mg | ORAL_TABLET | Freq: Four times a day (QID) | ORAL | Status: DC

## 2023-04-19 MED ORDER — TETANUS-DIPHTH-ACELL PERTUSSIS 5-2.5-18.5 LF-MCG/0.5 IM SUSY: 0.5000 mL | PREFILLED_SYRINGE | Freq: Once | INTRAMUSCULAR | Status: DC

## 2023-04-19 NOTE — Lactation Note (Signed)
This note was copied from a baby's chart. Lactation Consultation Note  Patient Name: Melanie Frederick [id number] Date: 04/19/2023 Age: 30 hours Reason for consult: Initial assessment;NICU baby;1st time breastfeeding  Mother has pumped 4 times since delivery/recovery, about every 3hrs. Infant being transferred to [facility] NICU in a few hours with plan for mother to be transferred to the same facility to be with her baby. Pumping basics reviewed. Mother has large, pendulous breasts with short/slightly everted nipples. Mother using 24mm flanges and the initiate mode on the Symphony pump appropriately. Observed pump session with appropriate fit and comfort noted. Mother expressed 4ml this session with most sessions between 2-51ml. Reviewed pump care and milk storage guidelines. Advised using Symphony while inpatient with possible need to rent a Symphony after discharge since she only has wearable pumps at home.   LC packed mother's colostrum for transport with baby under ice with report to baby and mother's nurse to ensure colostrum travels with them for transfer to new hospital.   No further questions at this time. Advised follow up while inpatient and outpatient.   Maternal Data Has patient been taught Hand Expression?: Yes Does the patient have breastfeeding experience prior to this delivery?: No  Feeding Mother's Current Feeding Choice: Breast Milk   Lactation Tools Discussed/Used Tools: Pump Breast pump type: Double-Electric Breast Pump Pump Education: Setup, frequency, and cleaning;Milk Storage Reason for Pumping: NICU Pumping frequency: 8x/24hrs Pumped volume: 4 mL  Interventions Interventions: DEBP;Education  Discharge Pump: DEBP;Personal (DEBP Symphony in the hospital, wearable "Abana" pump for home use from insurance) WIC Program: No (reports she doesn't qualify)  Consult Status Consult Status: NICU follow-up (transfer planned to [facility], to be followed  to lactation team)   Queen Slough 04/19/2023, 7:11 AM

## 2023-04-19 NOTE — H&P (Signed)
OBSTETRIC ADMISSION HISTORY AND PHYSICAL  Melanie Frederick is a 32 y.o. female G1P1001 POD1 s/p 1LTCS due to arrest of descent transferred as baby girl was transferred here for NICU. Currently her pain is well controlled. She is voiding spontaneously. She is breastfeeding/pumping.   Prenatal History/Complications:   GHTN - no meds Asthma - mild.  BMI 52  Past Medical History: Past Medical History:  Diagnosis Date   Anxiety    Asthma 10/24/2022   Fatigue    History of COVID-19 11/24/2020   Diagnosed January 2022   History of COVID-19 11/24/2020   Diagnosed January 2022   Nevus    Vaccine for human papilloma virus (HPV) types 6, 11, 16, and 18 administered     Past Surgical History: Past Surgical History:  Procedure Laterality Date   ARM SKIN LESION BIOPSY / EXCISION Left 2012   CESAREAN SECTION  04/18/2023   Procedure: CESAREAN SECTION;  Surgeon: Linzie Collin, MD;  Location: [facility] ORS;  Service: Obstetrics;;   LYMPH NODE DISSECTION Right 2011   MOLE REMOVAL Left    moval removal off of back    Obstetrical History: OB History     Gravida  1   Para  1   Term  1   Preterm  0   AB  0   Living  1      SAB  0   IAB  0   Ectopic  0   Multiple  0   Live Births  1           Social History Social History   Socioeconomic History   Marital status: Single    Spouse name: Jill Alexanders   Number of children: 0   Years of education: Not on file   Highest education level: Bachelor's degree (e.g., BA, AB, BS)  Occupational History   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  Other Topics Concern   Not on file  Social History Narrative   Not on file   Social Determinants of  Health   Financial Resource Strain: Low Risk  (09/03/2022)   Overall Financial Resource Strain (CARDIA)    Difficulty of Paying Living Expenses: Not very hard  Food Insecurity: No Food Insecurity (04/17/2023)   Hunger Vital Sign    Worried About Running Out of Food in the Last Year: Never true    Ran Out of Food in the Last Year: Never true  Transportation Needs: No Transportation Needs (04/17/2023)   PRAPARE - Administrator, Civil Service (Medical): No    Lack of Transportation (Non-Medical): No  Physical Activity: Insufficiently Active (09/03/2022)   Exercise Vital Sign    Days of Exercise per Week: 1 day    Minutes of Exercise per Session: 40 min  Stress: No Stress Concern Present (09/03/2022)   [employer] of Occupational Health - Occupational Stress Questionnaire    Feeling of Stress : Not at all  Social Connections: Moderately Integrated (09/03/2022)   Social Connection and Isolation Panel [NHANES]    Frequency of Communication with Friends and Family: More than three times a week    Frequency of Social Gatherings with Friends  and Family: More than three times a week    Attends Religious Services: 1 to 4 times per year    Active Member of Clubs or Organizations: No    Attends Engineer, structural: Never    Marital Status: Living with partner    Family History: Family History  Problem Relation Age of Onset   Colon polyps Mother    Gout Brother    Migraines Brother    Hypothyroidism Paternal Grandmother    Heart attack Paternal Grandmother    Brain cancer Other        malignant, great mat uncle-60   Cancer Other 60       colon cancer    Allergies: Allergies  Allergen Reactions   Mushroom Extract Complex Hives and Swelling   Prednisone Palpitations    No medications prior to admission.     Review of Systems   All systems reviewed and negative except as stated in HPI  Blood pressure 114/65, pulse 90, temperature 98.1 F (36.7 C),  temperature source Oral, resp. rate 17, last menstrual period 07/08/2022, SpO2 98%, unknown if currently breastfeeding. General appearance: alert, cooperative, and no distress Lungs: clear to auscultation bilaterally Heart: regular rate and rhythm Abdomen: soft, non-tender; bowel sounds normal, incision dressing c/d/I.  Pelvic: Not examined Extremities: Homans sign is negative, no sign of DVT     Prenatal labs: ABO, Rh: --/--/A POS (07/10 0810) Antibody: NEG (07/10 0810) Rubella: <0.90 (12/11 1055) RPR: NON REACTIVE (07/10 1610)  HBsAg: Negative (12/11 1055)  HIV: Non Reactive (04/10 0955)  GBS: Negative/-- (06/28 1448)  1 hr Glucola 124 Genetic screening  normal  Prenatal Transfer Tool  Maternal Diabetes: No Genetic Screening: Normal Maternal Ultrasounds/Referrals: Other: bowed femurs Fetal Ultrasounds or other Referrals:  None Maternal Substance Abuse:  No Significant Maternal Medications:  None Significant Maternal Lab Results:  Group B Strep negative Number of Prenatal Visits:greater than 3 verified prenatal visits Other Comments:  None  Results for orders placed or performed during the hospital encounter of 04/17/23 (from the past 24 hour(s))  Protein / creatinine ratio, urine   Collection Time: 04/18/23  3:17 PM  Result Value Ref Range   Creatinine, Urine 64 mg/dL   Total Protein, Urine 14 mg/dL   Protein Creatinine Ratio 0.22 (H) 0.00 - 0.15 mg/mg[Cre]  CBC   Collection Time: 04/19/23  5:55 AM  Result Value Ref Range   WBC 17.4 (H) 4.0 - 10.5 K/uL   RBC 3.33 (L) 3.87 - 5.11 MIL/uL   Hemoglobin 10.3 (L) 12.0 - 15.0 g/dL   HCT 96.0 (L) 45.4 - 09.8 %   MCV 91.0 80.0 - 100.0 fL   MCH 30.9 26.0 - 34.0 pg   MCHC 34.0 30.0 - 36.0 g/dL   RDW 11.9 14.7 - 82.9 %   Platelets 219 150 - 400 K/uL   nRBC 0.0 0.0 - 0.2 %    Patient Active Problem List   Diagnosis Date Noted   History of C-section 04/19/2023   Labor and delivery, indication for care 04/17/2023   [redacted]  weeks gestation of pregnancy 04/08/2023   [redacted] weeks gestation of pregnancy 03/29/2023   Obesity affecting pregnancy in third trimester 02/06/2023   Abnormal fetal ultrasound 02/06/2023   BMI 45.0-49.9, adult (HCC) 10/24/2022   First pregnancy 09/03/2022    Assessment/Plan:  Melanie Frederick is a 32 y.o. G1P1001 POD1 s/p pLTCS   Postpartum - Lovenox started this AM - Continue breastpumping - Rh pos, rubella immune -  Continue routine postop/PP care  GHTN - Started lasix - Monitor BP to see if medication needed. Thus far, no indication for BP medication.   Neonatal - Baby girl in the NICU    Milas Hock, MD  04/19/2023, 2:58 PM

## 2023-04-19 NOTE — Progress Notes (Signed)
Progress Note - Cesarean Delivery  Melanie Frederick is a 32 y.o. G1P1001 now PP day 1 s/p C-Section, Low Vertical.   Subjective:  Patient reports no problems with eating, bowel movements, voiding, or their wound  Her baby is being transferred to [facility] and she would like to be transferred to [facility] to accompany her baby.  She reports her pain is well-controlled.  She has voided today without a Foley catheter.  She has been out of bed without issue.  Objective:  Vital signs in last 24 hours: Temp:  [97.9 F (36.6 C)-98.8 F (37.1 C)] 98.6 F (37 C) (07/12 0810) Pulse Rate:  [75-102] 98 (07/12 0810) Resp:  [13-24] 18 (07/12 0810) BP: (113-160)/(68-99) 115/68 (07/12 0810) SpO2:  [95 %-99 %] 98 % (07/12 0810)  Physical Exam:  General: alert, cooperative, and no distress Lochia: appropriate Uterine Fundus: firm Incision: Dressing intact-dry    Data Review Recent Labs    04/17/23 0633 04/19/23 0555  HGB 12.7 10.3*  HCT 36.9 30.3*    Assessment:  Principal Problem:   Labor and delivery, indication for care Active Problems:   Obesity affecting pregnancy in third trimester   Abnormal fetal ultrasound     Overview: Bowed short femurs bilaterally. Normal genetics.   Status post Cesarean section. Doing well postoperatively.   Patient desires transfer to accompany her baby to [place].  Plan:       Discharge patient-transfer to [facility]..  Continue routine postoperative care status post cesarean delivery.  Elonda Husky, M.D. 04/19/2023 8:36 AM

## 2023-04-19 NOTE — Anesthesia Post-op Follow-up Note (Signed)
  Anesthesia Pain Follow-up Note  Patient: Melanie Frederick  Day #: 1  Date of Follow-up: 04/19/2023 Time: 8:39 AM  Last Vitals:  Vitals:   04/19/23 0745 04/19/23 0810  BP: 129/85 115/68  Pulse: (!) 102 98  Resp: 18 18  Temp: 36.8 C 37 C  SpO2: 99% 98%    Level of Consciousness: alert  Pain: none   Side Effects:None  Catheter Site Exam:clean, dry, no drainage  Anti-Coag Meds (From admission, onward)    Start     Dose/Rate Route Frequency Ordered Stop   04/19/23 0800  enoxaparin (LOVENOX) injection 75 mg        0.5 mg/kg  151 kg Subcutaneous Every 24 hours 04/19/23 0723          Plan: D/C from anesthesia care at surgeon's request  Konnie Noffsinger Lawerance Cruel

## 2023-04-19 NOTE — Anesthesia Postprocedure Evaluation (Signed)
Anesthesia Post Note  Patient: Melanie Frederick  Procedure(s) Performed: CESAREAN SECTION  Patient location during evaluation: Mother Baby Anesthesia Type: Epidural Level of consciousness: awake and alert Pain management: pain level controlled Vital Signs Assessment: post-procedure vital signs reviewed and stable Respiratory status: spontaneous breathing, nonlabored ventilation and respiratory function stable Cardiovascular status: stable Postop Assessment: no headache, no backache and epidural receding Anesthetic complications: no   No notable events documented.   Last Vitals:  Vitals:   04/19/23 0745 04/19/23 0810  BP: 129/85 115/68  Pulse: (!) 102 98  Resp: 18 18  Temp: 36.8 C 37 C  SpO2: 99% 98%    Last Pain:  Vitals:   04/19/23 0750  TempSrc:   PainSc: 5                  Deontrey Massi Lawerance Cruel

## 2023-04-19 NOTE — Discharge Summary (Signed)
Physician Obstetric Discharge Summary  Patient Name: Melanie Frederick DOB: 1991/04/14 MRN: [id number]                            Discharge Summary  Date of Admission: 04/17/2023 Date of Discharge: 04/19/2023 Delivering Provider: Linzie Collin  Admitting Diagnosis: Labor and delivery, indication for care [O75.9] at [redacted]w[redacted]d Secondary diagnosis:  Principal Problem:   Labor and delivery, indication for care Active Problems:   Obesity affecting pregnancy in third trimester   Abnormal fetal ultrasound   Cephalopelvic disproportion -failure to descend -occiput posterior- -fetal head impaction  Mode of Delivery:       low uterine, transverse     Discharge diagnosis: Term Pregnancy Delivered    Cephalopelvic disproportion -occiput posterior -fetal head impaction  Intrapartum Procedures: Atificial rupture of membranes and epidural   Post partum procedures:   Complications: none                     Discharge Day SOAP Note:  Subjective:  The patient has no complaints.  She is ambulating well. She is taking PO well. Pain is well controlled with current medications. Patient is urinating without difficulty.   She is passing flatus.    Objective  Vital signs: BP 115/68   Pulse 98   Temp 98.6 F (37 C)   Resp 18   Ht 5\' 7"  (1.702 m)   Wt (!) 151 kg   LMP 07/08/2022 (Exact Date)   SpO2 98%   Breastfeeding Unknown   BMI 52.16 kg/m   Physical Exam: Gen: NAD Abdomen:  Dressing intact Fundus Fundal Tone: Firm  Lochia Amount: Scant     Data Review Labs: Lab Results  Component Value Date   WBC 17.4 (H) 04/19/2023   HGB 10.3 (L) 04/19/2023   HCT 30.3 (L) 04/19/2023   MCV 91.0 04/19/2023   PLT 219 04/19/2023      Latest Ref Rng & Units 04/19/2023    5:55 AM 04/17/2023    6:33 AM 01/16/2023    9:55 AM  CBC  WBC 4.0 - 10.5 K/uL 17.4  11.3  9.6   Hemoglobin 12.0 - 15.0 g/dL 78.2  95.6  21.3   Hematocrit 36.0 - 46.0 % 30.3  36.9  35.1   Platelets 150 - 400 K/uL  219  249  295    A POS  Edinburgh Score:    04/19/2023    7:58 AM  Edinburgh Postnatal Depression Scale Screening Tool  I have been able to laugh and see the funny side of things. 0  I have looked forward with enjoyment to things. 0  I have blamed myself unnecessarily when things went wrong. 0  I have been anxious or worried for no good reason. 0  I have felt scared or panicky for no good reason. 0  Things have been getting on top of me. 0  I have been so unhappy that I have had difficulty sleeping. 0  I have felt sad or miserable. 0  I have been so unhappy that I have been crying. 0  The thought of harming myself has occurred to me. 0  Edinburgh Postnatal Depression Scale Total 0    Assessment:  Principal Problem:   Labor and delivery, indication for care Active Problems:   Obesity affecting pregnancy in third trimester   Abnormal fetal ultrasound     Overview: Bowed short femurs bilaterally. Normal genetics.  Doing well.  Normal progress as expected. Patient desires discharge/transferred to [facility] to be with her baby. Plan:  Discharge to [facility]  Modified rest as directed - may slowly resume normal activities with restrictions  as discussed.          Discharge Instructions: Per After Visit Summary. Activity: Advance as tolerated. Pelvic rest for 6 weeks.  Also refer to After Visit Summary.  Wound care discussed. Diet: Regular Medications: Allergies as of 04/19/2023       Reactions   Mushroom Extract Complex Hives, Swelling   Prednisone Palpitations        Medication List     STOP taking these medications    aspirin EC 81 MG tablet   doxylamine (Sleep) 25 MG tablet Commonly known as: UNISOM   hydrOXYzine 25 MG capsule Commonly known as: Vistaril   multivitamin-prenatal 27-0.8 MG Tabs tablet   prednisoLONE acetate 1 % ophthalmic suspension Commonly known as: PRED FORTE       Outpatient follow up:   Follow-up Information     Linzie Collin, MD Follow up in 1 week(s).   Specialties: Obstetrics and Gynecology, Radiology Contact information: [address] [place] [address] [phone]                Postpartum contraception: Will discuss at first post-partum visit.  Discharged Condition: good  Discharged to:  [place]  Newborn Data: Disposition: Baby being transferred to [facility]  Apgars: APGAR (1 MIN): 2  APGAR (5 MINS): 7  APGAR (10 MINS): 7    Elonda Husky, M.D. 04/19/2023 9:07 AM

## 2023-04-19 NOTE — Lactation Note (Signed)
This note was copied from a baby's chart.  NICU Lactation Consultation Note  Patient Name: Girl Cecile Macadangdang [id number]'O Date: 04/19/2023 Age:32 years  Reason for consult: NICU baby; Primapara; 1st time breastfeeding; Term; Initial assessment; Other (Comment) (transfer from [facility])  SUBJECTIVE  LC in to visit with P1 Mom of term baby delivered by C/S and  transferred from [facility] at 20 hrs of life, due to seizure activity.  Baby is currently NPO  LC has initiated double pumping and LC set up the pump parts for Mom to continue her consistent pumping.    Reviewed basics of pumping and washing of pump parts routine.  Encouraged asking for help prn.  Mom plans to go to NICU to visit her baby today.  Encouraged STS with baby if she can.    OBJECTIVE Infant data: Mother's Current Feeding Choice: Breast Milk  Infant feeding assessment No data recorded  Maternal data: G1P1001 C-Section, Low Vertical Has patient been taught Hand Expression?: Yes (With LC at [facility]) Significant Breast History:: ++ breast changes Current breast feeding challenges:: Infant separation Does the patient have breastfeeding experience prior to this delivery?: No Pumping frequency: Encouraged to pump 8 times per 24 hrs Pumped volume: 0 mL Flange Size: 21 Risk factor for low milk supply:: Infant separation  Pump: Hands Free, Personal (obtained through insurance)  ASSESSMENT Infant: Feeding Status: NPO  Maternal: Milk volume: Normal  INTERVENTIONS/PLAN Interventions: Interventions: Breast feeding basics reviewed; Skin to skin; Breast massage; Hand express; DEBP; Education Tools: Pump; Flanges Pump Education: Setup, frequency, and cleaning; Milk Storage  Plan: Consult Status: NICU follow-up NICU Follow-up type: New admission follow up   [name] 04/19/2023, 11:29 AM

## 2023-04-20 DIAGNOSIS — J45909 Unspecified asthma, uncomplicated: Secondary | ICD-10-CM | POA: Diagnosis not present

## 2023-04-20 MED ORDER — POLYETHYLENE GLYCOL 3350 17 G PO PACK
17.0000 g | PACK | Freq: Every day | ORAL | Status: DC | PRN
  Administered 2023-04-20: 17 g via ORAL
  Filled 2023-04-20: qty 1

## 2023-04-20 MED ORDER — BISACODYL 10 MG RE SUPP
10.0000 mg | Freq: Once | RECTAL | Status: AC
  Administered 2023-04-20: 10 mg via RECTAL
  Filled 2023-04-20: qty 1

## 2023-04-20 MED ORDER — FUROSEMIDE 20 MG PO TABS
20.0000 mg | ORAL_TABLET | Freq: Two times a day (BID) | ORAL | Status: DC
  Administered 2023-04-20 – 2023-04-21 (×2): 20 mg via ORAL
  Filled 2023-04-20 (×2): qty 1

## 2023-04-20 NOTE — Progress Notes (Signed)
Subjective: Postpartum Day 2: Cesarean Delivery Patient reports incisional pain, tolerating PO, and no problems voiding.    Objective: Vital signs in last 24 hours: Temp:  [98 F (36.7 C)-98.4 F (36.9 C)] 98 F (36.7 C) (07/13 0003) Pulse Rate:  [82-97] 83 (07/13 0003) Resp:  [16-18] 18 (07/13 0003) BP: (108-131)/(58-82) 131/66 (07/13 0003) SpO2:  [98 %-100 %] 99 % (07/13 0003)  Physical Exam:  General: alert, cooperative, and no distress Lochia: appropriate Uterine Fundus: firm Incision: healing well, no significant drainage, no dehiscence, no significant erythema DVT Evaluation: No evidence of DVT seen on physical exam.  Recent Labs    04/19/23 0555  HGB 10.3*  HCT 30.3*    Assessment/Plan: Status post Cesarean section. Doing well postoperatively.  Continue current care Anticipate discharge tomorrow.  Lazaro Arms, MD 04/20/2023, 8:37 AM

## 2023-04-20 NOTE — Lactation Note (Signed)
This note was copied from a baby's chart.  NICU Lactation Consultation Note  Patient Name: Melanie Frederick [id number] Date: 04/20/2023 Age:30 hours  Reason for consult: Follow-up assessment; Primapara; 1st time breastfeeding; NICU baby; Term; Other (Comment) (Transfer from [facility])  SUBJECTIVE Visited with family of 76 hours old FT NICU female; Melanie Frederick is a P1 and reports she's been pumping consistently during the day but skipping pumping sessions at night. Her plan is to eventually take baby to breast along with pumping and bottle feeding with her breastmilk. Reviewed pumping schedule, lactogenesis II and anticipatory guidelines.  OBJECTIVE Infant data: Mother's Current Feeding Choice: Breast Milk and Donor Milk  Infant feeding assessment No data recorded   Maternal data: G1P1001 C-Section, Low Vertical Has patient been taught Hand Expression?: Yes (With LC at [facility]) Significant Breast History:: ++ breast changes Current breast feeding challenges:: NICU admission Does the patient have breastfeeding experience prior to this delivery?: No Pumping frequency: 4-5 times/24 hours Pumped volume: 10 mL Flange Size: 21 Risk factor for low milk supply:: primipara, prematurity, infant separation  Pump: Hands Free, Personal (obtained through insurance)  ASSESSMENT Infant: Feeding Status: Scheduled 8-11-2-5  Maternal: Milk volume: Normal  INTERVENTIONS/PLAN Interventions: Interventions: Breast feeding basics reviewed; DEBP; Education Tools: Pump; Flanges; Coconut oil Pump Education: Setup, frequency, and cleaning  Plan: Encouraged pumping every 3 hours, ideally 8 pumping sessions/24 hours Breast massage, hand expression and coconut oil/nipple butter were also encouraged Parents will try engaging in STS if possible  Melanie Frederick's brother present and supportive. All questions and concerns answered, family to contact [facility] services PRN.  Consult Status: NICU follow-up NICU  Follow-up type: Maternal D/C visit   Melanie Frederick 04/20/2023, 6:33 PM

## 2023-04-20 NOTE — Plan of Care (Signed)
  Problem: Education: Goal: Knowledge of General Education information will improve Description: Including pain rating scale, medication(s)/side effects and non-pharmacologic comfort measures Outcome: Progressing   Problem: Health Behavior/Discharge Planning: Goal: Ability to manage health-related needs will improve Outcome: Progressing   Problem: Clinical Measurements: Goal: Ability to maintain clinical measurements within normal limits will improve Outcome: Progressing Goal: Will remain free from infection Outcome: Progressing Goal: Diagnostic test results will improve Outcome: Progressing Goal: Respiratory complications will improve Outcome: Progressing Goal: Cardiovascular complication will be avoided Outcome: Progressing   Problem: Activity: Goal: Risk for activity intolerance will decrease Outcome: Progressing   Problem: Nutrition: Goal: Adequate nutrition will be maintained Outcome: Progressing   Problem: Coping: Goal: Level of anxiety will decrease Outcome: Progressing   Problem: Elimination: Goal: Will not experience complications related to bowel motility Outcome: Progressing Goal: Will not experience complications related to urinary retention Outcome: Progressing   Problem: Pain Managment: Goal: General experience of comfort will improve Outcome: Progressing   Problem: Safety: Goal: Ability to remain free from injury will improve Outcome: Progressing   Problem: Skin Integrity: Goal: Risk for impaired skin integrity will decrease Outcome: Progressing   Problem: Education: Goal: Knowledge of the prescribed therapeutic regimen will improve Outcome: Progressing Goal: Understanding of sexual limitations or changes related to disease process or condition will improve Outcome: Progressing Goal: Individualized Educational Video(s) Outcome: Progressing   Problem: Self-Concept: Goal: Communication of feelings regarding changes in body function or  appearance will improve Outcome: Progressing   Problem: Skin Integrity: Goal: Demonstration of wound healing without infection will improve Outcome: Progressing   Problem: Coping: Goal: Ability to identify and utilize available resources and services will improve Outcome: Progressing   Problem: Life Cycle: Goal: Chance of risk for complications during the postpartum period will decrease Outcome: Progressing   Problem: Role Relationship: Goal: Ability to demonstrate positive interaction with newborn will improve Outcome: Progressing   Problem: Skin Integrity: Goal: Demonstration of wound healing without infection will improve Outcome: Progressing

## 2023-04-21 DIAGNOSIS — J45909 Unspecified asthma, uncomplicated: Secondary | ICD-10-CM | POA: Diagnosis not present

## 2023-04-21 DIAGNOSIS — Z98891 History of uterine scar from previous surgery: Secondary | ICD-10-CM | POA: Diagnosis not present

## 2023-04-21 MED ORDER — FUROSEMIDE 20 MG PO TABS: 20.0000 mg | ORAL_TABLET | Freq: Two times a day (BID) | ORAL | 0 refills | Status: DC

## 2023-04-21 MED ORDER — IBUPROFEN 600 MG PO TABS: 600.0000 mg | ORAL_TABLET | Freq: Four times a day (QID) | ORAL | 0 refills | Status: AC

## 2023-04-21 MED ORDER — OXYCODONE HCL 5 MG PO TABS: 5.0000 mg | ORAL_TABLET | ORAL | 0 refills | Status: DC | PRN

## 2023-04-21 NOTE — Discharge Summary (Signed)
Physician Discharge Summary  Patient ID: Melanie Frederick MRN: [id number] DOB/AGE: 03-09-1991 32 y.o.  Admit date: 04/19/2023 Discharge date: 04/21/2023  Admission Diagnoses: S/P Caesarean section, transferred because of baby transfer to our NICU  Discharge Diagnoses:  Principal Problem:   History of C-section   Discharged Condition: good  Hospital Course: unremarkable post op course, ^swelling-->lasix BP well controlled otherwise, had gHTN no meds  Consults:   Significant Diagnostic Studies: labs:      Latest Ref Rng & Units 04/19/2023    5:55 AM 04/17/2023    6:33 AM 01/16/2023    9:55 AM  CBC  WBC 4.0 - 10.5 K/uL 17.4  11.3  9.6   Hemoglobin 12.0 - 15.0 g/dL 40.9  81.1  91.4   Hematocrit 36.0 - 46.0 % 30.3  36.9  35.1   Platelets 150 - 400 K/uL 219  249  295      Treatments: post op care  Discharge Exam: Blood pressure 137/74, pulse (!) 101, temperature 98.2 F (36.8 C), temperature source Oral, resp. rate 18, last menstrual period 07/08/2022, SpO2 99%, unknown if currently breastfeeding. General appearance: alert, cooperative, and no distress GI: soft, non-tender; bowel sounds normal; no masses,  no organomegaly Incision/Wound:clean dry intact dressing was changed to a honeycomb  Disposition: Discharge disposition: 01-Home or Self Care       Discharge Instructions     Call MD for:  persistant nausea and vomiting   Complete by: As directed    Call MD for:  severe uncontrolled pain   Complete by: As directed    Call MD for:  temperature >100.4   Complete by: As directed    Diet - low sodium heart healthy   Complete by: As directed    Driving Restrictions   Complete by: As directed    No driving for 1 week   Increase activity slowly   Complete by: As directed    Leave dressing on - Keep it clean, dry, and intact until clinic visit   Complete by: As directed    Lifting restrictions   Complete by: As directed    No more than 20 pounds for 6 weeks    Sexual Activity Restrictions   Complete by: As directed    No sex for 6 weeks      Allergies as of 04/21/2023       Reactions   Mushroom Extract Complex Hives, Swelling   Prednisone Palpitations        Medication List     TAKE these medications    furosemide 20 MG tablet Commonly known as: LASIX Take 1 tablet (20 mg total) by mouth 2 (two) times daily.   ibuprofen 600 MG tablet Commonly known as: ADVIL Take 1 tablet (600 mg total) by mouth every 6 (six) hours.   oxyCODONE 5 MG immediate release tablet Commonly known as: Oxy IR/ROXICODONE Take 1-2 tablets (5-10 mg total) by mouth every 4 (four) hours as needed for moderate pain.               Discharge Care Instructions  (From admission, onward)           Start     Ordered   04/21/23 0000  Leave dressing on - Keep it clean, dry, and intact until clinic visit        04/21/23 0739            Follow-up Information     [facility] Follow up on 04/26/2023.  Why: post op visit Contact information: [address] [place] [address] [phone]                Signed: Lazaro Arms 04/21/2023, 7:41 AM

## 2023-04-21 NOTE — Lactation Note (Signed)
This note was copied from a baby's chart.  NICU Lactation Consultation Note  Patient Name: Melanie Frederick Date: 04/21/2023 Age:30 hours  Reason for consult: Follow-up assessment; 1st time breastfeeding; Primapara; NICU baby; Term; Maternal discharge; Other (Comment) (Transfer from [facility])  SUBJECTIVE Visited with family of 4 hours old FT NICU female; Ms. Husk is a P1  and reports she's been pumping consistently, and woke up one time last night. She continues getting enough colostrum to collect but not at every pumping session. Reinforced normalcy patterns at this age. She's getting discharged today. Reviewed discharge education, pumping schedule, pumping settings, lactogenesis II and anticipatory guidelines.   OBJECTIVE Infant data: Mother's Current Feeding Choice: Breast Milk and Donor Milk  Maternal data: G1P1001 C-Section, Low Vertical Current breast feeding challenges:: NICU admission Pumping frequency: 6 times/24 hours Pumped volume: 8 mL (8-9 ml) Flange Size: 21 Risk factor for low milk supply:: primipara, prematurity, infant separation  WIC Program: No WIC Referral Sent?: Yes What county?: [place] Pump: Hands Free, [facility] loaner # [phone] with return date of 05/05/2023)  ASSESSMENT Infant: Feeding Status: Scheduled 8-11-2-5  Maternal: Milk volume: Normal  INTERVENTIONS/PLAN Interventions: Interventions: Breast feeding basics reviewed; DEBP; Education Discharge Education: Engorgement and breast care Tools: Pump; Flanges; Coconut oil Pump Education: Setup, frequency, and cleaning; Milk Storage  Plan: Encouraged to continue pumping every 3 hours, ideally 8 pumping sessions/24 hours She'll take all pump parts to baby's room after her discharge She'll change her pump settings from initiation to maintenance mode once she starts getting 20 ml of EBM combined She'll call for latch assistance when baby is ready to go to breast   Female  visitor present. All questions and concerns answered, family to contact [facility] services PRN.  Consult Status: NICU follow-up NICU Follow-up type: Verify absence of engorgement; Verify onset of copious milk   Lavanya Roa S Madonna Flegal 04/21/2023, 10:58 AM

## 2023-04-21 NOTE — Plan of Care (Signed)
Problem: Education: Goal: Knowledge of General Education information will improve Description: Including pain rating scale, medication(s)/side effects and non-pharmacologic comfort measures 04/21/2023 0858 by Samuella Cota, RN Outcome: Completed/Met 04/20/2023 1930 by Samuella Cota, RN Outcome: Progressing   Problem: Health Behavior/Discharge Planning: Goal: Ability to manage health-related needs will improve 04/21/2023 0858 by Samuella Cota, RN Outcome: Completed/Met 04/20/2023 1930 by Samuella Cota, RN Outcome: Progressing   Problem: Clinical Measurements: Goal: Ability to maintain clinical measurements within normal limits will improve 04/21/2023 0858 by Samuella Cota, RN Outcome: Completed/Met 04/20/2023 1930 by Samuella Cota, RN Outcome: Progressing Goal: Will remain free from infection 04/21/2023 0858 by Samuella Cota, RN Outcome: Completed/Met 04/20/2023 1930 by Samuella Cota, RN Outcome: Progressing Goal: Diagnostic test results will improve 04/21/2023 0858 by Samuella Cota, RN Outcome: Completed/Met 04/20/2023 1930 by Samuella Cota, RN Outcome: Progressing Goal: Respiratory complications will improve 04/21/2023 0858 by Samuella Cota, RN Outcome: Completed/Met 04/20/2023 1930 by Samuella Cota, RN Outcome: Progressing Goal: Cardiovascular complication will be avoided 04/21/2023 0858 by Samuella Cota, RN Outcome: Completed/Met 04/20/2023 1930 by Samuella Cota, RN Outcome: Progressing   Problem: Activity: Goal: Risk for activity intolerance will decrease 04/21/2023 0858 by Samuella Cota, RN Outcome: Completed/Met 04/20/2023 1930 by Samuella Cota, RN Outcome: Progressing   Problem: Nutrition: Goal: Adequate nutrition will be maintained 04/21/2023 0858 by Samuella Cota, RN Outcome: Completed/Met 04/20/2023 1930 by Samuella Cota, RN Outcome: Progressing   Problem: Coping: Goal: Level of anxiety will decrease 04/21/2023 0858 by Samuella Cota, RN Outcome:  Completed/Met 04/20/2023 1930 by Samuella Cota, RN Outcome: Progressing   Problem: Elimination: Goal: Will not experience complications related to bowel motility 04/21/2023 0858 by Samuella Cota, RN Outcome: Completed/Met 04/20/2023 1930 by Samuella Cota, RN Outcome: Progressing Goal: Will not experience complications related to urinary retention 04/21/2023 0858 by Samuella Cota, RN Outcome: Completed/Met 04/20/2023 1930 by Samuella Cota, RN Outcome: Progressing   Problem: Pain Managment: Goal: General experience of comfort will improve 04/21/2023 0858 by Samuella Cota, RN Outcome: Completed/Met 04/20/2023 1930 by Samuella Cota, RN Outcome: Progressing   Problem: Safety: Goal: Ability to remain free from injury will improve 04/21/2023 0858 by Samuella Cota, RN Outcome: Completed/Met 04/20/2023 1930 by Samuella Cota, RN Outcome: Progressing   Problem: Skin Integrity: Goal: Risk for impaired skin integrity will decrease 04/21/2023 0858 by Samuella Cota, RN Outcome: Completed/Met 04/20/2023 1930 by Samuella Cota, RN Outcome: Progressing   Problem: Education: Goal: Knowledge of the prescribed therapeutic regimen will improve 04/21/2023 0858 by Samuella Cota, RN Outcome: Completed/Met 04/20/2023 1930 by Samuella Cota, RN Outcome: Progressing Goal: Understanding of sexual limitations or changes related to disease process or condition will improve 04/21/2023 0858 by Samuella Cota, RN Outcome: Completed/Met 04/20/2023 1930 by Samuella Cota, RN Outcome: Progressing Goal: Individualized Educational Video(s) 04/21/2023 0858 by Samuella Cota, RN Outcome: Completed/Met 04/20/2023 1930 by Samuella Cota, RN Outcome: Progressing   Problem: Self-Concept: Goal: Communication of feelings regarding changes in body function or appearance will improve 04/21/2023 0858 by Samuella Cota, RN Outcome: Completed/Met 04/20/2023 1930 by Samuella Cota, RN Outcome: Progressing   Problem: Skin  Integrity: Goal: Demonstration of wound healing without infection will improve 04/21/2023 0858 by Samuella Cota, RN Outcome: Completed/Met 04/20/2023 1930 by Samuella Cota, RN Outcome: Progressing   Problem: Coping: Goal: Ability to identify and utilize  available resources and services will improve 04/21/2023 0858 by Samuella Cota, RN Outcome: Completed/Met 04/20/2023 1930 by Samuella Cota, RN Outcome: Progressing   Problem: Life Cycle: Goal: Chance of risk for complications during the postpartum period will decrease 04/21/2023 0858 by Samuella Cota, RN Outcome: Completed/Met 04/20/2023 1930 by Samuella Cota, RN Outcome: Progressing   Problem: Role Relationship: Goal: Ability to demonstrate positive interaction with newborn will improve 04/21/2023 0858 by Samuella Cota, RN Outcome: Completed/Met 04/20/2023 1930 by Samuella Cota, RN Outcome: Progressing   Problem: Skin Integrity: Goal: Demonstration of wound healing without infection will improve 04/21/2023 0858 by Samuella Cota, RN Outcome: Completed/Met 04/20/2023 1930 by Samuella Cota, RN Outcome: Progressing

## 2023-04-22 ENCOUNTER — Encounter (HOSPITAL_COMMUNITY): Payer: Self-pay | Admitting: Obstetrics and Gynecology

## 2023-04-22 ENCOUNTER — Inpatient Hospital Stay: Admit: 2023-04-22 | Payer: Self-pay

## 2023-04-22 ENCOUNTER — Inpatient Hospital Stay (HOSPITAL_COMMUNITY)
Admission: AD | Admit: 2023-04-22 | Discharge: 2023-04-22 | Disposition: A | Discharge status: Home / Self Care | Payer: 59 | Attending: Obstetrics and Gynecology | Admitting: Obstetrics and Gynecology

## 2023-04-22 DIAGNOSIS — Z4889 Encounter for other specified surgical aftercare: Secondary | ICD-10-CM | POA: Diagnosis not present

## 2023-04-22 DIAGNOSIS — O99893 Other specified diseases and conditions complicating puerperium: Secondary | ICD-10-CM | POA: Diagnosis not present

## 2023-04-22 DIAGNOSIS — Z4801 Encounter for change or removal of surgical wound dressing: Secondary | ICD-10-CM | POA: Diagnosis not present

## 2023-04-22 DIAGNOSIS — Z711 Person with feared health complaint in whom no diagnosis is made: Secondary | ICD-10-CM

## 2023-04-22 NOTE — MAU Note (Signed)
..  Melanie Frederick is a 32 y.o. at [facility] c-section 04/18/2023  here in MAU reporting: elevated blood pressure of 150/99. Denies headache, visual changes, or epigastric pain. Was told to take it at home because she had some elevated in the hospital.  C-section dressing is coming off and has some drainage.   Pain score: 4/10 soreness under dressing.  Vitals:   04/22/23 2130  BP: 137/72  Pulse: 94  Resp: 19  Temp: 98.2 F (36.8 C)  SpO2: 100%      Lab orders placed from triage:none

## 2023-04-22 NOTE — MAU Provider Note (Signed)
History     CSN: [id number]  Arrival date and time: 04/22/23 2044   Event Date/Time   First Provider Initiated Contact with Patient 04/22/23 2242      Chief Complaint  Patient presents with   Hypertension   Dressing coming off   Melanie Frederick , a  31 y.o. G1P1001 at 4 days PP  presents to MAU with complaints of elevated BPs at home and concerns for her Incision. Patient states that she had some elevated BPs intrapartum and was recommended to routinely check BPs at home. She states that her BP ad was noted to be 150/99. She reports using her cuff on her upper arm. She denies headache, blurred vision, visual disturbances, epigastric pain or new onset of swelling.   She also has concerns for her dressing coming off and her incision getting infected.          OB History     Gravida  1   Para  1   Term  1   Preterm  0   AB  0   Living  1      SAB  0   IAB  0   Ectopic  0   Multiple  0   Live Births  1           Past Medical History:  Diagnosis Date   Anxiety    Asthma 10/24/2022   Fatigue    History of COVID-19 11/24/2020   Diagnosed January 2022   History of COVID-19 11/24/2020   Diagnosed January 2022   Nevus    Vaccine for human papilloma virus (HPV) types 6, 11, 16, and 18 administered     Past Surgical History:  Procedure Laterality Date   ARM SKIN LESION BIOPSY / EXCISION Left 2012   CESAREAN SECTION  04/18/2023   Procedure: CESAREAN SECTION;  Surgeon: Linzie Collin, MD;  Location: [facility] ORS;  Service: Obstetrics;;   LYMPH NODE DISSECTION Right 2011   MOLE REMOVAL Left    moval removal off of back    Family History  Problem Relation Age of Onset   Colon polyps Mother    Gout Brother    Migraines Brother    Hypothyroidism Paternal Grandmother    Heart attack Paternal Grandmother    Brain cancer Other        malignant, great mat uncle-60   Cancer Other 60       colon cancer    Social History   Tobacco Use   Smoking  status: Never   Smokeless tobacco: Never  Vaping Use   Vaping status: Never Used  Substance Use Topics   Alcohol use: Not Currently    Comment: occasinally before pregnancy   Drug use: No    Allergies:  Allergies  Allergen Reactions   Mushroom Extract Complex Hives and Swelling   Prednisone Palpitations    Medications Prior to Admission  Medication Sig Dispense Refill Last Dose   furosemide (LASIX) 20 MG tablet Take 1 tablet (20 mg total) by mouth 2 (two) times daily. 10 tablet 0 04/22/2023   ibuprofen (ADVIL) 600 MG tablet Take 1 tablet (600 mg total) by mouth every 6 (six) hours. 30 tablet 0 04/22/2023   oxyCODONE (OXY IR/ROXICODONE) 5 MG immediate release tablet Take 1-2 tablets (5-10 mg total) by mouth every 4 (four) hours as needed for moderate pain. 30 tablet 0     Review of Systems  Constitutional:  Negative for chills, fatigue and fever.  Eyes:  Negative for pain and visual disturbance.  Respiratory:  Negative for apnea, shortness of breath and wheezing.   Cardiovascular:  Negative for chest pain and palpitations.  Gastrointestinal:  Negative for abdominal pain, constipation, diarrhea, nausea and vomiting.  Genitourinary:  Negative for difficulty urinating, dysuria, pelvic pain, vaginal bleeding, vaginal discharge and vaginal pain.  Musculoskeletal:  Negative for back pain.  Neurological:  Negative for seizures, weakness and headaches.  Psychiatric/Behavioral:  Negative for suicidal ideas.    Physical Exam   Blood pressure 131/78, pulse 93, temperature 98.2 F (36.8 C), temperature source Oral, resp. rate 19, height 5\' 7"  (1.702 m), weight (!) 151 kg, SpO2 100%, unknown if currently breastfeeding.  Physical Exam Vitals and nursing note reviewed.  Constitutional:      General: She is not in acute distress.    Appearance: Normal appearance.  HENT:     Head: Normocephalic.  Pulmonary:     Effort: Pulmonary effort is normal.  Abdominal:     Palpations: Abdomen is  soft.     Comments: Honeycomb dressing lifting across the top and on the sides. Dressing removed.   Intact dry steri-strips noted underneath. Incision edges approximated well. No redness or drainage noted. No bleeding noted on HC dressing   Musculoskeletal:     Cervical back: Normal range of motion.  Skin:    General: Skin is warm and dry.  Neurological:     Mental Status: She is alert and oriented to person, place, and time.  Psychiatric:        Mood and Affect: Mood normal.    Patient Vitals for the past 24 hrs:  BP Temp Temp src Pulse Resp SpO2 Height Weight  04/22/23 2301 128/74 -- -- 95 -- -- -- --  04/22/23 2246 126/71 -- -- 98 -- -- -- --  04/22/23 2231 131/78 -- -- 93 -- -- -- --  04/22/23 2216 121/76 -- -- 99 -- -- -- --  04/22/23 2201 115/61 -- -- 96 -- -- -- --  04/22/23 2146 122/69 -- -- 93 -- -- -- --  04/22/23 2142 123/72 -- -- 97 -- -- -- --  04/22/23 2130 137/72 98.2 F (36.8 C) Oral 94 19 100 % 5\' 7"  (1.702 m) (!) 151 kg     MAU Course  Procedures Orders Placed This Encounter  Procedures   Discharge patient    MDM - VS normal in MAU.  - Checked patient home cuff and noted to be too small for patient size. Recommended to take BPs on lower arm.  - Low suspicion for PreE, and infection.  - Incision redressed with new honeycomb dressing per patient request   Assessment and Plan   1. Physically well but worried   2. Postpartum care following cesarean delivery    - Checked with MAU cuff and both top and bottom numbers noted to be off by 20-30 systolic and diastolic.  - Recommended to have cuff recalibrated or get a cuff that fits.  - Worsening signs and return precautions reviewed.  - Patient discharged home in stable condition and may return to MAU as needed.   Claudette Head, MSN CNM  04/22/2023, 10:42 PM

## 2023-04-23 ENCOUNTER — Telehealth: Payer: Self-pay

## 2023-04-23 NOTE — Telephone Encounter (Signed)
[facility]- Discharge Call Backs- Spoke to patient on the phone about the following below. 1-Do you have any questions or concerns about yourself as you heal?No  C-Sec and transfer to [facility] with baby. Still there. 2-Any concerns or questions about your baby?No 3-How was your stay at the hospital?Good 5- Did our team work together to care for you?Yes You should be receiving a survey in the mail soon.   We would really appreciate it if you could fill that out for [place] and return it in the mail.  We value the feedback to make improvements and continue the great work we do.   If you have any questions please feel free to call me back at [phone]

## 2023-04-23 NOTE — Telephone Encounter (Signed)
Pt called after hour nurse 02/20/23 8:34pm; notes today that she had a c/s on Thursday and notes that her ankles and calves are swollen; her BP is high, most recently 155/99 @ 2010.  Also, c/o her dressing is coming loose.  Denies H/A, dizziness, lightheadedness.  Pt was adv to go to [facility] for evaluation.

## 2023-04-24 ENCOUNTER — Ambulatory Visit (HOSPITAL_COMMUNITY): Payer: Self-pay

## 2023-04-24 ENCOUNTER — Ambulatory Visit: Payer: 59 | Admitting: Obstetrics

## 2023-04-24 NOTE — Lactation Note (Signed)
This note was copied from a baby's chart.  NICU Lactation Consultation Note  Patient Name: Melanie Frederick [id number] Date: 04/24/2023 Age:32 years  Reason for consult: Follow-up assessment; Mother's request; NICU baby; Primapara; 1st time breastfeeding; Term  SUBJECTIVE  LC in to visit with P1 Mom of baby "Melanie Frederick" in the NICU.  FOB doing STS with baby in recliner.  Baby to have an MRI of her brain today.  Mom had concerns about her left breast.  She feels a knot deep in the breast that is slightly tender.  Milk came to volume in last 24 hrs, expressing 120 ml on average per pumping.    LC palpated breast, no engorgement.  Slight full duct palpated.  Mom reports that the knot goes away after she pumps.  No erythema of breast, no fever, body aches or any symptoms of mastitis.    LC provided Mom with a heat pack and encouraged warm compress and very gentle massage prior to and during pumping.  Engorgement prevention and treatment reviewed.  Mom to ask for [facility] support as needed.  OBJECTIVE Infant data: No data recorded Infant feeding assessment Scale for Readiness: 3   Maternal data: G1P1001 C-Section, Low Vertical Pumping frequency: 6-8 times per 24 hrs Pumped volume: 120 mL Flange Size: 21  WIC Program: No WIC Referral Sent?: Yes What county?: [place] Pump: Hands Free, [facility] Loaner [facility] loaner # [phone] with return date of 05/05/2023)  ASSESSMENT Infant: Feeding Status: Scheduled 8-11-2-5  Maternal: Milk volume: Normal  INTERVENTIONS/PLAN Interventions: Interventions: Breast feeding basics reviewed; Skin to skin; Breast massage; Hand express; DEBP; Education Discharge Education: Engorgement and breast care Tools: Pump; Flanges; Hands-free pumping top Pump Education: Setup, frequency, and cleaning; Milk Storage  Plan: Consult Status: NICU follow-up NICU Follow-up type: Weekly NICU follow up; Verify absence of engorgement   Melanie Frederick 04/24/2023, 2:40  PM

## 2023-04-26 ENCOUNTER — Ambulatory Visit (INDEPENDENT_AMBULATORY_CARE_PROVIDER_SITE_OTHER): Payer: 59 | Admitting: Obstetrics

## 2023-04-26 ENCOUNTER — Encounter: Payer: Self-pay | Admitting: Obstetrics

## 2023-04-26 NOTE — Progress Notes (Signed)
  INCISION CHECK  Subjective HPI Melanie Frederick is a 32 y.o. G1P1001 who is 8 days postpartum. She presents today for an incision check. She reports some incisional soreness that improves with ibuprofen. She is having light vaginal bleeding but has noticed a few clots over the past few days. She is finishing out a prescription for Lasix. She was seen in the MAU for elevated BPs at home, but BPs at the hospital were normal. She is pumping breast milk and visiting the baby daily.  Past Medical History:  Diagnosis Date   Abnormal fetal ultrasound 02/06/2023   Bowed short femurs bilaterally. Normal genetics.     Anxiety    Asthma 10/24/2022   Fatigue    History of COVID-19 11/24/2020   Diagnosed January 2022   History of COVID-19 11/24/2020   Diagnosed January 2022   Labor and delivery, indication for care 04/17/2023   Nevus    Vaccine for human papilloma virus (HPV) types 6, 11, 16, and 18 administered    Patient Active Problem List   Diagnosis Date Noted   History of C-section 04/19/2023   BMI 45.0-49.9, adult (HCC) 10/24/2022   Past Surgical History:  Procedure Laterality Date   ARM SKIN LESION BIOPSY / EXCISION Left 2012   CESAREAN SECTION  04/18/2023   Procedure: CESAREAN SECTION;  Surgeon: Linzie Collin, MD;  Location: [facility] ORS;  Service: Obstetrics;;   LYMPH NODE DISSECTION Right 2011   MOLE REMOVAL Left    moval removal off of back   Objective  BP 124/86   Pulse (!) 102   Wt (!) 315 lb 1.6 oz (142.9 kg)   Breastfeeding Yes   BMI 49.35 kg/m   Physical exam:  Abdomen: soft, non-tender Incision: moderate amount of dried yellowish-brown drainage noted at bottom of honeycomb dressing with mild odor. Incision is well approximated, mild erythema along incision site, no active drainage or bleeding, no red streaks or exudate, non-tender to palpation, steristrips intact.  Assessment  Postop incision check Healing well  Plan  -Encouraged to lift panniculus to  ensure incision stays clean and dry. Honeycomb removed, clean, dry pad applied -Reviewed s/s of infection -Return in one week for in-person PP visit and at 6 weeks PP

## 2023-05-02 ENCOUNTER — Encounter: Payer: Self-pay | Admitting: Obstetrics and Gynecology

## 2023-05-02 ENCOUNTER — Ambulatory Visit (INDEPENDENT_AMBULATORY_CARE_PROVIDER_SITE_OTHER): Payer: 59 | Admitting: Obstetrics and Gynecology

## 2023-05-02 ENCOUNTER — Ambulatory Visit (HOSPITAL_COMMUNITY): Payer: Self-pay

## 2023-05-02 VITALS — BP 116/80 | HR 82 | Ht 67.0 in | Wt 313.7 lb

## 2023-05-02 DIAGNOSIS — Z9889 Other specified postprocedural states: Secondary | ICD-10-CM

## 2023-05-02 NOTE — Progress Notes (Signed)
HPI:      [name] is a 32 y.o. G1P1001 who LMP was No LMP recorded.  Subjective:   She presents today for follow-up wound care after cesarean delivery.  She states that the pain has improved with her wound and she denies discharge.  She denies fevers. She reports that her baby is doing well.  She says the only thing holding up discharge is that she needs to begin feeding.  Mom is pumping and giving 100% breastmilk. She is considering IUD for birth control.    Hx: The following portions of the patient's history were reviewed and updated as appropriate:             She  has a past medical history of Abnormal fetal ultrasound (02/06/2023), Anxiety, Asthma (10/24/2022), Fatigue, History of COVID-19 (11/24/2020), History of COVID-19 (11/24/2020), Labor and delivery, indication for care (04/17/2023), Nevus, and Vaccine for human papilloma virus (HPV) types 6, 11, 16, and 18 administered. She does not have any pertinent problems on file. She  has a past surgical history that includes Lymph node dissection (Right, 2011); Arm skin lesion biopsy / excision (Left, 2012); Mole removal (Left); and Cesarean section (04/18/2023). Her family history includes Brain cancer in an other family member; Cancer (age of onset: 36) in an other family member; Colon polyps in her mother; Gout in her brother; Heart attack in her paternal grandmother; Hypothyroidism in her paternal grandmother; Migraines in her brother. She  reports that she has never smoked. She has never used smokeless tobacco. She reports that she does not currently use alcohol. She reports that she does not use drugs. She has a current medication list which includes the following prescription(s): furosemide, ibuprofen, and oxycodone. She is allergic to mushroom extract complex and prednisone.       Review of Systems:  Review of Systems  Constitutional: Denied constitutional symptoms, night sweats, recent illness, fatigue, fever, insomnia  and weight loss.  Eyes: Denied eye symptoms, eye pain, photophobia, vision change and visual disturbance.  Ears/Nose/Throat/Neck: Denied ear, nose, throat or neck symptoms, hearing loss, nasal discharge, sinus congestion and sore throat.  Cardiovascular: Denied cardiovascular symptoms, arrhythmia, chest pain/pressure, edema, exercise intolerance, orthopnea and palpitations.  Respiratory: Denied pulmonary symptoms, asthma, pleuritic pain, productive sputum, cough, dyspnea and wheezing.  Gastrointestinal: Denied, gastro-esophageal reflux, melena, nausea and vomiting.  Genitourinary: Denied genitourinary symptoms including symptomatic vaginal discharge, pelvic relaxation issues, and urinary complaints.  Musculoskeletal: Denied musculoskeletal symptoms, stiffness, swelling, muscle weakness and myalgia.  Dermatologic: Denied dermatology symptoms, rash and scar.  Neurologic: Denied neurology symptoms, dizziness, headache, neck pain and syncope.  Psychiatric: Denied psychiatric symptoms, anxiety and depression.  Endocrine: Denied endocrine symptoms including hot flashes and night sweats.   Meds:   Current Outpatient Medications on File Prior to Visit  Medication Sig Dispense Refill   furosemide (LASIX) 20 MG tablet Take 1 tablet (20 mg total) by mouth 2 (two) times daily. 10 tablet 0   ibuprofen (ADVIL) 600 MG tablet Take 1 tablet (600 mg total) by mouth every 6 (six) hours. 30 tablet 0   oxyCODONE (OXY IR/ROXICODONE) 5 MG immediate release tablet Take 1-2 tablets (5-10 mg total) by mouth every 4 (four) hours as needed for moderate pain. (Patient not taking: Reported on 04/26/2023) 30 tablet 0   No current facility-administered medications on file prior to visit.      Objective:     Vitals:   05/02/23 0803  BP: 116/80  Pulse: 82   Filed  Weights   05/02/23 0803  Weight: (!) 313 lb 11.2 oz (142.3 kg)               Abdomen: Soft.  Non-tender.  No masses.  No HSM.  Incision/s: Intact.   Healing well.  No erythema.  No drainage.   Steri-Strips removed          Assessment:    G1P1001 Patient Active Problem List   Diagnosis Date Noted   History of C-section 04/19/2023   BMI 45.0-49.9, adult (HCC) 10/24/2022     1. Postoperative state   2. Postpartum care following cesarean delivery     Patient with excellent recovery.  Incision looks great.   Plan:            1.  Wound care discussed  2.  IUD for birth control discussed.  Mirena versus copper IUD discussed. Orders No orders of the defined types were placed in this encounter.   No orders of the defined types were placed in this encounter.     F/U  Return in about 4 weeks (around 05/30/2023).  Elonda Husky, M.D. 05/02/2023 8:47 AM

## 2023-05-02 NOTE — Progress Notes (Signed)
Patient presents today for 2 week postpartum follow-up. She had a cesarean delivery on 04/18/23. Mom is breast feeding and pumping. She states she would like a copper IUD or a non hormonal kind of birth control. EPDS score of 0. She states her incision remains sore and she is curious how it is healing.

## 2023-05-02 NOTE — Lactation Note (Signed)
This note was copied from a baby's chart.  NICU Lactation Consultation Note  Patient Name: Melanie Frederick [id number] Date: 05/02/2023 Age:30 wk.o.  Reason for consult: Weekly NICU follow-up; Primapara; 1st time breastfeeding; NICU baby; Term; Other (Comment); Breastfeeding assistance; Mother's request (Transfer from [facility])  SUBJECTIVE Visited with family of 93 weeks old FT NICU female twice to check on pumping status and for the 2 pm feeding assist (baby was taken for an MRI at 11 am); Melanie Frederick is a P1 and reports pumping is going well, she tried latching baby "Melanie Frederick" for the first time yesterday but she didn't latch, she was too fussy. Baby awake and alert but noticed a continues strider (almost sounded like hiccups). It took her some time to establish a sucking pattern on gloved finger but once she was transitioned to the R pumped breast in cross cradle hold, she wouldn't suck. Tried a NS # 20 with the same results, nipple shield will sit on baby's mouth and as she kept moving to "re-latched" she became fussy again, left couplet doing STS care after this attempt. Reviewed pumping schedule, lactogenesis III, feeding readiness, IDF 1/2 and anticipatory guidelines.  OBJECTIVE Infant data: Mother's Current Feeding Choice: Breast Milk  Infant feeding assessment Scale for Readiness: 2 (LC at bedside to assist with breastfeeding)   Maternal data: G1P1001 C-Section, Low Vertical Pumping frequency: 7 times/24 hours Pumped volume: 60 mL (60-120 ml) Flange Size: 21  WIC Program: No WIC Referral Sent?: Yes What county?: [place] Pump: Hands Free, [facility] Loaner [facility] loaner # [phone] with return date of 05/05/2023)  ASSESSMENT Infant: LATCH Documentation Latch: 0 Audible Swallowing: 0 Type of Nipple: 1 (short shafted) Comfort (Breast/Nipple): 2 Hold (Positioning): 1 LATCH Score: 4  Feeding Status: Scheduled 8-11-2-5  Maternal: Milk volume:  Normal  INTERVENTIONS/PLAN Interventions: Interventions: Assisted with latch; Breast feeding basics reviewed; Breast massage; Breast compression; Adjust position; Support pillows; Coconut oil; DEBP; Education; Pre-pump if needed; Infant Driven Feeding Algorithm education Tools: Pump; Flanges; Coconut oil; Nipple Shields Pump Education: Setup, frequency, and cleaning; Milk Storage Nipple shield size: 20  Plan: Encouraged to continue pumping every 3 hours, ideally 8 pumping sessions/24 hours She'll continue taking baby to a pumped breast on feeding cues around feeding times using NS # 20 and will call for assistance PRN.   No other support person at this time. All questions and concerns answered, family to contact [facility] services PRN.  Consult Status: NICU follow-up NICU Follow-up type: Weekly NICU follow up; Assist with IDF-1 (Mother to pre-pump before breastfeeding)   Wilmarie Sparlin Venetia Constable 05/02/2023, 2:51 PM

## 2023-05-03 ENCOUNTER — Ambulatory Visit (HOSPITAL_COMMUNITY): Payer: Self-pay

## 2023-05-03 NOTE — Lactation Note (Signed)
This note was copied from a baby's chart.  NICU Lactation Consultation Note  Patient Name: Girl Antania Ackers [id number] Date: 05/03/2023 Age:30 wk.o.  Reason for consult: Follow-up assessment; Breastfeeding assistance; Mother's request; Primapara; 1st time breastfeeding; NICU baby; Term  SUBJECTIVE  LC called to 324 as Mom couldn't find her 20 mm nipple shield.  LC offered to assist with positioning and latching at 11 am feeding, RN starting NG feeding.  Mom just finished a full pumping.  Baby noted to be having consistent stridor and some retractions with respirations.  RN states that was normal for baby, O2 sats are WNL.  Baby being held by FOB and baby noted to be staring up at his face with her mouth open.  Baby relaxed and not fussy.  LC noted an anterior short lingual frenulum and high arched palate.  Talked about the indications for revising would be difficulty with milk transfer and/or nipple trauma with breastfeeding.  Baby's tongue V-shaped when she tries to lift it as its tethered midline. Also, explained to Mom that a nipple shield can aid in baby's latch and milk transfer.  LC initiated a 24 mm nipple shield on right breast.  Baby positioned in football hold with pillow placement for support.  Baby did open her mouth for the breast, but was unable to sustain the latch. Repeated help re-latching baby.  Baby did suck a few times, milk noted in shield.  Milk leaked out of baby's mouth and she coughed.  Mom took baby off the breast right away.  LC assisted to burped baby and place her STS on Mom's chest where she looked comfortable.  Vital signs remained normal.  Baby noted to be staring with her neck extended looking at [facility] face.    Respirations noted to be 70's after breastfeeding attempt. NNP notified of LC's concern about baby's SSB coordination.  SLP to consult later today.  OBJECTIVE Infant data: Mother's Current Feeding Choice: Breast Milk  Infant feeding  assessment Scale for Readiness: 2   Maternal data: G1P1001 C-Section, Low Vertical Pumping frequency: 8 X 24 hrs Pumped volume: 120 mL Flange Size: 27  WIC Program: No WIC Referral Sent?: Yes What county?: [place] Pump: Hands Free  ASSESSMENT Infant: LATCH Documentation Latch: 1 Audible Swallowing: 0 Type of Nipple: 2 (short nipple shafts, nipple shield used) Comfort (Breast/Nipple): 2 Hold (Positioning): 0 LATCH Score: 5  Feeding Status: Scheduled 8-11-2-5  Maternal: Milk volume: Normal  INTERVENTIONS/PLAN Interventions: Interventions: Breast feeding basics reviewed; Assisted with latch; Skin to skin; Breast massage; Hand express; Pre-pump if needed; Support pillows; Position options; Expressed milk; DEBP; Education Tools: Pump; Flanges; Nipple Dorris Carnes Pump Education: Setup, frequency, and cleaning; Milk Storage Nipple shield size: 20; 24 (20 mm on left breast and 24 mm on right)  Plan: Consult Status: NICU follow-up NICU Follow-up type: Weekly NICU follow up   [name] 05/03/2023, 11:55 AM

## 2023-05-05 ENCOUNTER — Ambulatory Visit (HOSPITAL_COMMUNITY): Payer: Self-pay

## 2023-05-05 NOTE — Lactation Note (Signed)
This note was copied from a baby's chart.  NICU Lactation Consultation Note  Patient Name: Girl Jaleisa Bhat WPYKD'X Date: 05/05/2023 Age:30 wk.o.  Reason for consult: NICU baby; Primapara; 1st time breastfeeding; Term; Mother's request; RN request  SUBJECTIVE Visited with family of 37 weeks old FT NICU female; Ms. Mila is a P1 and requested assistance for the 2 pm feeding. Baby "[name]" awake and alert. Ms. Secundino has a great supply, she reports she continues pumping consistently and her supply continues to increase, praised her for her efforts. NICU RN Eunice Blase checked with [facility] about picking up the breastmilk in baby's room, noticed that there were bottles that were 62 days old. Ms. Pretti is thinking about getting a deep freezer since we might be running out of space in the INC. This LC took baby [name] to the R pumped breast in cross cradle hold and she was able to latch after a few attempts (see LATCH score). Swallows were hard to hear due to strider but noticed the dropping of the jaw and the NS feeding patterns during this 14 minutes feeding. [name] took several breaks and paced herself, but feeding had to be interrupted due to spitting up; no changes in HR, RR and SP02. Left couplet doing STS care while feeding tube was still running. Revised IDF 1/2, feeding readiness, stressors and anticipatory guidelines.  OBJECTIVE Infant data: Mother's Current Feeding Choice: Breast Milk  Infant feeding assessment Scale for Readiness: 2   Maternal data: G1P1001 C-Section, Low Vertical Pumping frequency: 7 times/24 hours Pumped volume: 120 mL (up to 180-240 ml in the AM) Flange Size: 27  WIC Program: No WIC Referral Sent?: Yes What county?: [place] Pump: Hands Free  ASSESSMENT Infant: LATCH Documentation Latch: 1 (with NS # 20) Audible Swallowing: 1 (pool of EBM on NS # 20) Type of Nipple: 2 (short shafted) Comfort (Breast/Nipple): 2 Hold (Positioning): 1 LATCH Score:  7  Feeding Status: Scheduled 8-11-2-5  Maternal: Milk volume: Normal  INTERVENTIONS/PLAN Interventions: Interventions: Breast feeding basics reviewed; Assisted with latch; Skin to skin; Breast compression; Adjust position; Support pillows; Coconut oil; DEBP; Education; Infant Driven Feeding Algorithm education Tools: Pump; Flanges; Nipple Shields; Coconut oil Pump Education: Setup, frequency, and cleaning; Milk Storage Nipple shield size: 20  Plan: Encouraged to continue pumping every 3 hours, ideally 8 pumping sessions/24 hours She'll continue taking baby to a pumped breast on feeding cues around feeding times using NS # 20 and will call for assistance PRN. She's aware of stopping feeding when noticing stressors   No other support person at this time. All questions and concerns answered, family to contact [facility] services PRN.  Consult Status: NICU follow-up NICU Follow-up type: Weekly NICU follow up; Assist with IDF-1 (Mother to pre-pump before breastfeeding)   Kathey Simer Venetia Constable 05/05/2023, 3:25 PM

## 2023-05-11 ENCOUNTER — Ambulatory Visit (HOSPITAL_COMMUNITY): Payer: Self-pay

## 2023-05-11 NOTE — Lactation Note (Signed)
This note was copied from a baby's chart.  NICU Lactation Consultation Note  Patient Name: Melanie Frederick WUJWJ'X Date: 05/11/2023 Age:30 wk.o.  Reason for consult: Follow-up assessment; NICU baby; Primapara; 1st time breastfeeding; Term  SUBJECTIVE  Baby's RN called LC as baby woke up and was cueing to feed.  LC came to room as soon as she could, but baby had just finished a 4 minute feeding.  LC noted nipple shield full of breast milk.  Baby noted to have increased WOB.  LC attempted to help baby burp and she spit a small to mod amount of breast milk and curdled milk.  Due to what appeared to be gulping for air, LC placed baby STS on Mom's chest where she looked much more comfortable.  LC to attend next feeding if baby cueing to latch again.  OBJECTIVE Infant data: No data recorded Infant feeding assessment Scale for Readiness: 2 (stridorous at rest and increased with pacifier)   Maternal data: G1P1001 C-Section, Low Vertical Pumping frequency: 8 times per 24 hrs Pumped volume: 120 mL Flange Size: 27  WIC Program: No WIC Referral Sent?: Yes What county?: [place] Pump: Hands Free  ASSESSMENT Infant: Feeding Status: Scheduled 8-11-2-5  Maternal: Milk volume: Normal  INTERVENTIONS/PLAN Interventions: Interventions: Skin to skin; Breast massage; Hand express; DEBP Tools: Pump; Flanges; Nipple Shields Nipple shield size: 20  Plan: Consult Status: NICU follow-up NICU Follow-up type: Assist with IDF-1 (Mother to pre-pump before breastfeeding)   Melanie Frederick 05/11/2023, 11:23 AM

## 2023-05-11 NOTE — Lactation Note (Signed)
This note was copied from a baby's chart.  NICU Lactation Consultation Note  Patient Name: Melanie Frederick WNUUV'O Date: 05/11/2023 Age:32 years  Reason for consult: Follow-up assessment; Breastfeeding assistance; NICU baby; Primapara; 1st time breastfeeding; Term  SUBJECTIVE  LC in to assist with 2 pm feeding.  Baby noted to be waking and showing some feeding cues.  Mom pre-pumped for 20 mins expressing 140 ml.  LC watched as Mom applied the 20 mm nipple shield, nipple short and not pulled very far into shield, but still attached.  Baby positioned in football hold on left breast.  Baby opening her mouth, but not very widely.  Baby able to latch and appear comfortable with sucking non-nutritive suck pattern.  Baby appeared disorganized with her suck/swallow/breathe pattern.  Baby for short periods, she would settle in and suck with deeper jaw extensions and SLP auscultated swallows.  Baby came off after 4 mins, burped on her own and then re- latched onto left breast again.  Baby appeared more awake and did not show fatigue or stress cues.  Baby fed on and off for 10 mins.  Milk filled the shield when baby fatigued and stopped sucking.  Baby taken off the breast as her eyes were rolling back, and didn't want the milk flow to cause her to choke or spit.    Baby placed STS on Mom's chest, she burped on her own and settled comfortably STS.  No emesis with this feeding.  SLP requested baby to start IDF.  NNP requests waiting.   OBJECTIVE Infant data: No data recorded Infant feeding assessment Scale for Readiness: 2 Scale for Quality: 3   Maternal data: G1P1001 C-Section, Low Vertical Pumping frequency: 8 times per 24 hrs Pumped volume: 120 mL Flange Size: 27  WIC Program: No WIC Referral Sent?: Yes What county?: [place] Pump: Hands Free  ASSESSMENT Infant: LATCH Documentation Latch: 1 Audible Swallowing: 1 (Dacia SLP auscultated swallowing) Type of Nipple: 2 Comfort  (Breast/Nipple): 2 Hold (Positioning): 1 LATCH Score: 7  Feeding Status: Scheduled 8-11-2-5  Maternal: Milk volume: Normal  INTERVENTIONS/PLAN Interventions: Interventions: Breast feeding basics reviewed; Assisted with latch; Skin to skin; Breast massage; Hand express; Pre-pump if needed; Adjust position; Support pillows; Position options; Expressed milk; DEBP; Education Tools: Nipple Shields Nipple shield size: 20  Plan: Consult Status: NICU follow-up NICU Follow-up type: Assist with IDF-1 (Mother to pre-pump before breastfeeding)   Melanie Frederick 05/11/2023, 2:56 PM

## 2023-05-13 ENCOUNTER — Ambulatory Visit (HOSPITAL_COMMUNITY): Payer: Self-pay

## 2023-05-13 NOTE — Lactation Note (Addendum)
This note was copied from a baby's chart.  NICU Lactation Consultation Note  Patient Name: Girl Lavina Penland ZOXWR'U Date: 05/13/2023 Age:30 wk.o.  Reason for consult: Follow-up assessment; Primapara; 1st time breastfeeding; Term; NICU baby; RN request  SUBJECTIVE  LC in to assist with 2 pm feeding.  LC late to assist.  Baby didn't wake for breastfeeding.  Mom holding baby in football hold against Mom's breast.  Baby sleeping.  LC spoke with Mom about pre-pumping 4-5 mins prior to latch attempts.  Nipple shield noted to be too large for Mom's nipple.  LC initiated a 16 mm nipple shield after using the hand pump to pre-pump and draw the nipple out.    Mom to continue trying trying to latch baby with feeding cues.  Mom will continue to support her milk supply with consistent pumping.   OBJECTIVE Infant data: No data recorded Infant feeding assessment Scale for Readiness: 3   Maternal data: G1P1001 C-Section, Low Vertical Pumping frequency: 8 times per 24 hrs Pumped volume: 150 mL Flange Size: 21  WIC Program: No WIC Referral Sent?: Yes What county?: [place] Pump: Hands Free  ASSESSMENT Infant: LATCH Documentation Latch: 1 Audible Swallowing: 1 Type of Nipple: 2 Comfort (Breast/Nipple): 2 Hold (Positioning): 1 LATCH Score: 7  Feeding Status: Scheduled 8-11-2-5  Maternal: Milk volume: Abundant  INTERVENTIONS/PLAN Interventions: Interventions: Breast feeding basics reviewed; Assisted with latch; Skin to skin; Breast massage; Hand express; Breast compression; Adjust position; Support pillows; Position options; DEBP; Education Tools: Pump; Flanges; Nipple Dorris Carnes; Hands-free pumping top Pump Education: Setup, frequency, and cleaning; Milk Storage Nipple shield size: 16; 20  Plan: Consult Status: NICU follow-up NICU Follow-up type: Weekly NICU follow up   [name] 05/13/2023, 3:02 PM

## 2023-05-13 NOTE — Lactation Note (Signed)
This note was copied from a baby's chart.  NICU Lactation Consultation Note  Patient Name: Girl Irany Gallucci ZOXWR'U Date: 05/13/2023 Age:30 wk.o.  Reason for consult: Follow-up assessment; Breastfeeding assistance; NICU baby; Primapara; 1st time breastfeeding; Term  SUBJECTIVE  [name]'s RN called LC asking for assist with breastfeeding.  RN stated that baby was sucking on her pacifier.  When LC arrived in room, Mom had baby supported well at the breast in football hold, with nipple shield in place, baby sleeping. Apparently baby hadn't latched and fed at this feeding. Mom had pre-pumped for 5 minutes until milk flow slowed down. LC noticed nipple shield not on well.  Hand pumped to evert nipple and then placed the shield on to draw nipple into the shield.  Mom noticed the difference.  Baby not cueing any more.  Gavage feeding running.  OBJECTIVE Infant data: No data recorded Infant feeding assessment Scale for Readiness: 2   Maternal data: G1P1001 C-Section, Low Vertical Pumping frequency: 8 times per 24 hrs Pumped volume: 150 mL Flange Size: 21  WIC Program: No WIC Referral Sent?: Yes What county?: [place] Pump: Hands Free  ASSESSMENT Infant: LATCH Documentation Latch: 0 Audible Swallowing: 0 Type of Nipple: 2 Comfort (Breast/Nipple): 2 (short nipple shafts, compressible areola) Hold (Positioning): 1 LATCH Score: 5  Feeding Status: Scheduled 8-11-2-5  Maternal: Milk volume: Abundant  INTERVENTIONS/PLAN Interventions: Interventions: Breast feeding basics reviewed; Assisted with latch; Skin to skin; Breast massage; Hand express; Pre-pump if needed; Adjust position; Support pillows; Position options; DEBP Tools: Nipple Dorris Carnes; Flanges; Pump; Hands-free pumping top Pump Education: Setup, frequency, and cleaning; Milk Storage Nipple shield size: 16  Plan: Consult Status: NICU follow-up NICU Follow-up type: Weekly NICU follow up   [name] 05/13/2023, 5:56 PM

## 2023-05-19 ENCOUNTER — Encounter: Payer: Self-pay | Admitting: Obstetrics and Gynecology

## 2023-05-20 ENCOUNTER — Encounter (HOSPITAL_COMMUNITY): Payer: Self-pay | Admitting: Obstetrics & Gynecology

## 2023-05-20 ENCOUNTER — Telehealth: Payer: Self-pay

## 2023-05-20 ENCOUNTER — Inpatient Hospital Stay (HOSPITAL_COMMUNITY)
Admission: AD | Admit: 2023-05-20 | Discharge: 2023-05-20 | Disposition: A | Discharge status: Home / Self Care | Payer: 59 | Attending: Obstetrics & Gynecology | Admitting: Obstetrics & Gynecology

## 2023-05-20 ENCOUNTER — Ambulatory Visit (HOSPITAL_COMMUNITY): Payer: Self-pay

## 2023-05-20 DIAGNOSIS — Z98891 History of uterine scar from previous surgery: Secondary | ICD-10-CM | POA: Diagnosis not present

## 2023-05-20 DIAGNOSIS — O8600 Infection of obstetric surgical wound, unspecified: Secondary | ICD-10-CM

## 2023-05-20 DIAGNOSIS — T8141XA Infection following a procedure, superficial incisional surgical site, initial encounter: Secondary | ICD-10-CM | POA: Diagnosis present

## 2023-05-20 DIAGNOSIS — O8601 Infection of obstetric surgical wound, superficial incisional site: Secondary | ICD-10-CM | POA: Insufficient documentation

## 2023-05-20 DIAGNOSIS — O900 Disruption of cesarean delivery wound: Secondary | ICD-10-CM | POA: Diagnosis present

## 2023-05-20 LAB — AEROBIC/ANAEROBIC CULTURE W GRAM STAIN (SURGICAL/DEEP WOUND)

## 2023-05-20 LAB — CBC WITH DIFFERENTIAL/PLATELET
Abs Immature Granulocytes: 0.01 10*3/uL (ref 0.00–0.07)
Basophils Absolute: 0.1 10*3/uL (ref 0.0–0.1)
Basophils Relative: 1 %
Eosinophils Absolute: 0.3 10*3/uL (ref 0.0–0.5)
Eosinophils Relative: 4 %
HCT: 36.1 % (ref 36.0–46.0)
Hemoglobin: 11.1 g/dL — ABNORMAL LOW (ref 12.0–15.0)
Immature Granulocytes: 0 %
Lymphocytes Relative: 33 %
Lymphs Abs: 2.3 10*3/uL (ref 0.7–4.0)
MCH: 28 pg (ref 26.0–34.0)
MCHC: 30.7 g/dL (ref 30.0–36.0)
MCV: 90.9 fL (ref 80.0–100.0)
Monocytes Absolute: 0.6 10*3/uL (ref 0.1–1.0)
Monocytes Relative: 9 %
Neutro Abs: 3.8 10*3/uL (ref 1.7–7.7)
Neutrophils Relative %: 53 %
Platelets: 335 10*3/uL (ref 150–400)
RBC: 3.97 MIL/uL (ref 3.87–5.11)
RDW: 13.2 % (ref 11.5–15.5)
WBC: 7 10*3/uL (ref 4.0–10.5)
nRBC: 0 % (ref 0.0–0.2)

## 2023-05-20 MED ORDER — CLINDAMYCIN HCL 300 MG PO CAPS
300.0000 mg | ORAL_CAPSULE | Freq: Once | ORAL | Status: AC
  Administered 2023-05-20: 300 mg via ORAL
  Filled 2023-05-20: qty 1

## 2023-05-20 MED ORDER — CLINDAMYCIN HCL 300 MG PO CAPS: 300.0000 mg | ORAL_CAPSULE | Freq: Four times a day (QID) | ORAL | 0 refills | Status: AC

## 2023-05-20 NOTE — Telephone Encounter (Signed)
Pt calling; is 4wks pp and c/s; left side of incision is tender, oozing blood and looks irritated.  Started yesterday. [phone]

## 2023-05-20 NOTE — Lactation Note (Signed)
This note was copied from a baby's chart.  NICU Lactation Consultation Note  Patient Name: Melanie Frederick NFAOZ'H Date: 05/20/2023 Age:30 wk.o.  Reason for consult: Weekly NICU follow-up; NICU baby; Primapara; 1st time breastfeeding; Mother's request; Term  SUBJECTIVE Visited with family of 63 weeks old FT NICU female; Ms. Rosencrantz is a P1 and requested lactation because she wanted to take baby to breast again. Baby "Melanie Frederick" has been sleepy and not showing a lot of feeding cues. Asked her to call for the next feeding at 2 pm or at 5 pm. She reported that pumping is going well and her volumes have slightly increased, praised her for her efforts. 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 Revised pumping schedule, feeding cues, IDF 1/2 and anticipatory guidelines.   Addendum @ 7 pm: NICU RN [employer] Voiced that baby did not wake up for either feeding.  OBJECTIVE Infant data: Mother's Current Feeding Choice: Breast Milk  Infant feeding assessment Scale for Readiness: 3 Scale for Quality: 4   Maternal data: G1P1001 C-Section, Low Vertical Pumping frequency: 7-8 times/24 hours Pumped volume: 180 mL Flange Size: 21  WIC Program: No WIC Referral Sent?: Yes What county?: [place] Pump: Hands Free  ASSESSMENT Infant: LATCH Documentation Latch: 0 Audible Swallowing: 0 Type of Nipple: 2 Comfort (Breast/Nipple): 2 Hold (Positioning): 2 LATCH Score: 6  Feeding Status: Scheduled 8-11-2-5  Maternal: Milk volume: Abundant  INTERVENTIONS/PLAN Interventions: Interventions: Breast feeding basics reviewed; DEBP; Education; Coconut oil; Infant Driven Feeding Algorithm education Tools: Pump; Flanges; Coconut oil Pump Education: Setup, frequency, and cleaning; Milk Storage  Plan: Encouraged to continue pumping every 3 hours, ideally 8 pumping sessions/24 hours She'll  continue taking baby to a pumped breast on feeding cues around feeding times using NS # 16-20 and will call for assistance PRN. She's aware of stopping feeding when noticing stressors   No other support person at this time. All questions and concerns answered, family to contact [facility] services PRN.  Consult Status: NICU follow-up NICU Follow-up type: Weekly NICU follow up; Assist with IDF-1 (Mother to pre-pump before breastfeeding)   Mercede Rollo Venetia Constable 05/20/2023, 7:09 PM

## 2023-05-20 NOTE — MAU Note (Signed)
.  Melanie Frederick is a 32 y.o. at PPC/S 4wk1d here in MAU reporting: bleeding and discharge at incision, pain and tenderness on the left and mid of incision, and redness around area that started yesterday 05/19/23. Pt reports today it looks worst. Pt denies taking pain medications. Pt states she previous it was healing, and has had a post op office appt had no concerns at that time.   Onset of complaint: yesterday Pain score: 4/10 Vitals:   05/20/23 2121  BP: 119/65  Pulse: 75  Resp: 18  Temp: 98.9 F (37.2 C)  SpO2: 100%      Lab orders placed from triage:

## 2023-05-20 NOTE — MAU Provider Note (Signed)
MAU Provider Note  History  [id number]  Arrival date and time: 05/20/23 2027   Chief Complaint  Patient presents with   Post-op Problem     HPI Melanie Frederick is a 32 y.o. G1P1001 s/p pLTCS on 04/18/23 for second stage arrest/cephalopelvic disproportion, who presents for concerns for surgical incision infection.  She had noticed over the past few days that her wound has been draining more white to reddish fluid and had been opening up after being closed.  She declines fevers, chills, shortness of breath, chest pain, headaches, vision changes.   Vaginal bleeding: No   --/--/A POS (07/10 0810)  OB History  Gravida Para Term Preterm AB Living  1 1 1  0 0 1  SAB IAB Ectopic Multiple Live Births  0 0 0 0 1    # Outcome Date GA Lbr Len/2nd Weight Sex Type Anes PTL Lv  1 Term 04/18/23 [redacted]w[redacted]d / 05:38 3670 g F CS-LVertical EPI  LIV     Birth Comments: delivered nonvigorous, HR ~60.  responded immediately to advanced NRP. did not require intubation..  transferred to NICU     Past Medical History:  Diagnosis Date   Abnormal fetal ultrasound 02/06/2023   Bowed short femurs bilaterally. Normal genetics.     Anxiety    Asthma 10/24/2022   Fatigue    History of COVID-19 11/24/2020   Diagnosed January 2022   History of COVID-19 11/24/2020   Diagnosed January 2022   Labor and delivery, indication for care 04/17/2023   Nevus    Vaccine for human papilloma virus (HPV) types 6, 11, 16, and 18 administered     Past Surgical History:  Procedure Laterality Date   ARM SKIN LESION BIOPSY / EXCISION Left 2012   CESAREAN SECTION  04/18/2023   Procedure: CESAREAN SECTION;  Surgeon: Linzie Collin, MD;  Location: [facility] ORS;  Service: Obstetrics;;   LYMPH NODE DISSECTION Right 2011   MOLE REMOVAL Left    moval removal off of back    Family History  Problem Relation Age of Onset   Colon polyps Mother    Gout Brother    Migraines Brother    Hypothyroidism Paternal Grandmother     Heart attack Paternal Grandmother    Brain cancer Other        malignant, great mat uncle-60   Cancer Other 60       colon cancer    Social History   Socioeconomic History   Marital status: Single    Spouse name: Jill Alexanders   Number of children: 0   Years of education: Not on file   Highest education level: Bachelor's degree (e.g., BA, AB, BS)  Occupational History   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  Other Topics Concern   Not on file  Social History Narrative   Not on file   Social Determinants of Health   Financial Resource Strain: Low Risk  (09/03/2022)   Overall Financial Resource Strain (CARDIA)    Difficulty of Paying Living Expenses: Not very hard  Food Insecurity: No Food Insecurity (04/21/2023)   Hunger Vital Sign    Worried About Running Out of Food  in the Last Year: Never true    Ran Out of Food in the Last Year: Never true  Transportation Needs: No Transportation Needs (04/21/2023)   PRAPARE - Administrator, Civil Service (Medical): No    Lack of Transportation (Non-Medical): No  Physical Activity: Insufficiently Active (09/03/2022)   Exercise Vital Sign    Days of Exercise per Week: 1 day    Minutes of Exercise per Session: 40 min  Stress: No Stress Concern Present (09/03/2022)   [employer] of Occupational Health - Occupational Stress Questionnaire    Feeling of Stress : Not at all  Social Connections: Moderately Integrated (09/03/2022)   Social Connection and Isolation Panel [NHANES]    Frequency of Communication with Friends and Family: More than three times a week    Frequency of Social Gatherings with Friends and Family: More than three times a  week    Attends Religious Services: 1 to 4 times per year    Active Member of [employer] or Organizations: No    Attends Banker Meetings: Never    Marital Status: Living with partner  Intimate Partner Violence: Not At Risk (04/21/2023)   Humiliation, Afraid, Rape, and Kick questionnaire    Fear of Current or Ex-Partner: No    Emotionally Abused: No    Physically Abused: No    Sexually Abused: No    Allergies  Allergen Reactions   Mushroom Extract Complex Hives and Swelling   Prednisone Palpitations    No current facility-administered medications on file prior to encounter.   Current Outpatient Medications on File Prior to Encounter  Medication Sig Dispense Refill   furosemide (LASIX) 20 MG tablet Take 1 tablet (20 mg total) by mouth 2 (two) times daily. 10 tablet 0   ibuprofen (ADVIL) 600 MG tablet Take 1 tablet (600 mg total) by mouth every 6 (six) hours. 30 tablet 0   oxyCODONE (OXY IR/ROXICODONE) 5 MG immediate release tablet Take 1-2 tablets (5-10 mg total) by mouth every 4 (four) hours as needed for moderate pain. (Patient not taking: Reported on 04/26/2023) 30 tablet 0    ROS: Pertinent positives and negative per HPI, all others reviewed and negative  Physical Exam   BP 112/62 (BP Location: Right Arm)   Pulse 71   Temp 98.9 F (37.2 C) (Oral)   Resp 18   Ht 5\' 7"  (1.702 m)   Wt (!) 139.8 kg   SpO2 100%   Breastfeeding Yes   BMI 48.29 kg/m   Patient Vitals for the past 24 hrs:  BP Temp Temp src Pulse Resp SpO2 Height Weight  05/20/23 2325 112/62 -- -- 71 -- -- -- --  05/20/23 2139 121/66 -- -- 70 -- -- -- --  05/20/23 2121 119/65 98.9 F (37.2 C) Oral 75 18 100 % 5\' 7"  (1.702 m) (!) 139.8 kg    Physical Exam Vitals reviewed.  Constitutional:      Appearance: Normal appearance.  HENT:     Head: Normocephalic and atraumatic.     Right Ear: External ear normal.     Left Ear: External ear normal.  Cardiovascular:     Rate and Rhythm: Normal rate  and regular rhythm.  Pulmonary:     Effort: Pulmonary effort is normal.     Breath sounds: Normal breath sounds.  Abdominal:     General: Abdomen is flat.     Palpations: Abdomen is soft.  Skin:    General: Skin is warm.  Capillary Refill: Capillary refill takes less than 2 seconds.     Comments: 1-61mm incisional opening with purulent drainage, mild erythema surrounding  Psychiatric:        Mood and Affect: Mood normal.        Labs Results for orders placed or performed during the hospital encounter of 05/20/23 (from the past 24 hour(s))  CBC with Differential/Platelet     Status: Abnormal   Collection Time: 05/20/23 10:33 PM  Result Value Ref Range   WBC 7.0 4.0 - 10.5 K/uL   RBC 3.97 3.87 - 5.11 MIL/uL   Hemoglobin 11.1 (L) 12.0 - 15.0 g/dL   HCT 11.9 14.7 - 82.9 %   MCV 90.9 80.0 - 100.0 fL   MCH 28.0 26.0 - 34.0 pg   MCHC 30.7 30.0 - 36.0 g/dL   RDW 56.2 13.0 - 86.5 %   Platelets 335 150 - 400 K/uL   nRBC 0.0 0.0 - 0.2 %   Neutrophils Relative % 53 %   Neutro Abs 3.8 1.7 - 7.7 K/uL   Lymphocytes Relative 33 %   Lymphs Abs 2.3 0.7 - 4.0 K/uL   Monocytes Relative 9 %   Monocytes Absolute 0.6 0.1 - 1.0 K/uL   Eosinophils Relative 4 %   Eosinophils Absolute 0.3 0.0 - 0.5 K/uL   Basophils Relative 1 %   Basophils Absolute 0.1 0.0 - 0.1 K/uL   Immature Granulocytes 0 %   Abs Immature Granulocytes 0.01 0.00 - 0.07 K/uL    Imaging No results found.  MAU Course  MDM: moderate  This patient presents to the ED for concern of   Chief Complaint  Patient presents with   Post-op Problem    MAU Course: -Wound evaluated, small purulent drainage noted in addition to serosanguineous fluid.  Picture taken.  - Wound culture obtained,  CBC ordered. Clindamycin ordered. -CBC normal  After the interventions noted above, I reevaluated the patient and found that they have :improved  Dispostion: discharged   Assessment and Plan  1. Wound infection following cesarean  section, postpartum - Discharge patient  2. Superficial incisional infection of surgical site - Discharge patient  3. History of C-section - Discharge patient   Clindamycin given since patient is lactating and would like to cover for MRSA, anaerobic culture obtained.  May need to adjust antibiotics depending on culture.  Message sent to clinic to evaluate incision within the week.  Return precautions given.  Discharge Instructions     Discharge patient   Complete by: As directed    Discharge disposition: 01-Home or Self Care   Discharge patient date: 05/20/2023      Allergies as of 05/20/2023       Reactions   Mushroom Extract Complex Hives, Swelling   Prednisone Palpitations        Medication List     TAKE these medications    clindamycin 300 MG capsule Commonly known as: Cleocin Take 1 capsule (300 mg total) by mouth 4 (four) times daily for 7 days.   furosemide 20 MG tablet Commonly known as: LASIX Take 1 tablet (20 mg total) by mouth 2 (two) times daily.   ibuprofen 600 MG tablet Commonly known as: ADVIL Take 1 tablet (600 mg total) by mouth every 6 (six) hours.   oxyCODONE 5 MG immediate release tablet Commonly known as: Oxy IR/ROXICODONE Take 1-2 tablets (5-10 mg total) by mouth every 4 (four) hours as needed for moderate pain.  Myrtie Hawk, DO FMOB Fellow, Faculty practice [facility], [facility] 05/20/23  11:27 PM

## 2023-05-21 NOTE — Telephone Encounter (Signed)
Spoke with patient, she was seen by MAU. Currently started on antibiotics and has been scheduled by myself to follow-up Friday. Instructed to call the office if anything worsens.

## 2023-05-24 ENCOUNTER — Encounter: Payer: Self-pay | Admitting: Obstetrics and Gynecology

## 2023-05-24 ENCOUNTER — Other Ambulatory Visit: Payer: Self-pay | Admitting: Family Medicine

## 2023-05-24 ENCOUNTER — Ambulatory Visit (INDEPENDENT_AMBULATORY_CARE_PROVIDER_SITE_OTHER): Payer: 59 | Admitting: Obstetrics and Gynecology

## 2023-05-24 VITALS — BP 108/74 | HR 67 | Ht 67.0 in | Wt 311.4 lb

## 2023-05-24 DIAGNOSIS — O8600 Infection of obstetric surgical wound, unspecified: Secondary | ICD-10-CM

## 2023-05-24 DIAGNOSIS — Z9889 Other specified postprocedural states: Secondary | ICD-10-CM

## 2023-05-24 MED ORDER — CIPROFLOXACIN HCL 750 MG PO TABS: 750.0000 mg | ORAL_TABLET | Freq: Two times a day (BID) | ORAL | 0 refills | Status: AC

## 2023-05-24 NOTE — Progress Notes (Signed)
HPI:      [name] is a 32 y.o. G1P1001 who LMP was No LMP recorded.  Subjective:   She presents today approximately 6 weeks from cesarean delivery for true CPD.  She reports that she went to the MAU for an incision check.  She is no longer having any issues with her incision.  She has been doing a good job of keeping it clean and dry. She reports that her baby is doing much better.  Likely will get a gastric tube for feeding but is breast-feeding at this time.  Mom is pumping as well as having the baby latch on and feed.  Her baby should come home from the NICU next week. Melanie Frederick desires IUD for birth control. She reports a rash underneath her arms that is occasionally itchy.    Hx: The following portions of the patient's history were reviewed and updated as appropriate:             She  has a past medical history of Abnormal fetal ultrasound (02/06/2023), Anxiety, Asthma (10/24/2022), Fatigue, History of COVID-19 (11/24/2020), History of COVID-19 (11/24/2020), Labor and delivery, indication for care (04/17/2023), Nevus, and Vaccine for human papilloma virus (HPV) types 6, 11, 16, and 18 administered. She does not have any pertinent problems on file. She  has a past surgical history that includes Lymph node dissection (Right, 2011); Arm skin lesion biopsy / excision (Left, 2012); Mole removal (Left); and Cesarean section (04/18/2023). Her family history includes Brain cancer in an other family member; Cancer (age of onset: 51) in an other family member; Colon polyps in her mother; Gout in her brother; Heart attack in her paternal grandmother; Hypothyroidism in her paternal grandmother; Migraines in her brother. She  reports that she has never smoked. She has never used smokeless tobacco. She reports that she does not currently use alcohol. She reports that she does not use drugs. She has a current medication list which includes the following prescription(s): clindamycin and  ibuprofen. She is allergic to mushroom extract complex and prednisone.       Review of Systems:  Review of Systems  Constitutional: Denied constitutional symptoms, night sweats, recent illness, fatigue, fever, insomnia and weight loss.  Eyes: Denied eye symptoms, eye pain, photophobia, vision change and visual disturbance.  Ears/Nose/Throat/Neck: Denied ear, nose, throat or neck symptoms, hearing loss, nasal discharge, sinus congestion and sore throat.  Cardiovascular: Denied cardiovascular symptoms, arrhythmia, chest pain/pressure, edema, exercise intolerance, orthopnea and palpitations.  Respiratory: Denied pulmonary symptoms, asthma, pleuritic pain, productive sputum, cough, dyspnea and wheezing.  Gastrointestinal: Denied, gastro-esophageal reflux, melena, nausea and vomiting.  Genitourinary: Denied genitourinary symptoms including symptomatic vaginal discharge, pelvic relaxation issues, and urinary complaints.  Musculoskeletal: Denied musculoskeletal symptoms, stiffness, swelling, muscle weakness and myalgia.  Dermatologic: Denied dermatology symptoms, rash and scar.  Neurologic: Denied neurology symptoms, dizziness, headache, neck pain and syncope.  Psychiatric: Denied psychiatric symptoms, anxiety and depression.  Endocrine: Denied endocrine symptoms including hot flashes and night sweats.   Meds:   Current Outpatient Medications on File Prior to Visit  Medication Sig Dispense Refill   clindamycin (CLEOCIN) 300 MG capsule Take 1 capsule (300 mg total) by mouth 4 (four) times daily for 7 days. 27 capsule 0   ibuprofen (ADVIL) 600 MG tablet Take 1 tablet (600 mg total) by mouth every 6 (six) hours. 30 tablet 0   No current facility-administered medications on file prior to visit.      Objective:  Vitals:   05/24/23 0924  BP: 108/74  Pulse: 67   Filed Weights   05/24/23 0924  Weight: (!) 311 lb 6.4 oz (141.3 kg)               Abdomen: Soft.  Non-tender.  No masses.   No HSM.  Incision/s: Intact.  Healing well.  No erythema.  No drainage.   Axilla examination reveals erythematous rash likely consistent with yeast.          Assessment:    G1P1001 Patient Active Problem List   Diagnosis Date Noted   History of C-section 04/19/2023   BMI 45.0-49.9, adult (HCC) 10/24/2022     1. Postoperative state   2. Postpartum care following cesarean delivery   3. Wound infection following cesarean section, postpartum     Possible cutaneous yeast infection   Wound healing excellent!!!!!!   Plan:            1.  Discussed use of antifungal cream for cutaneous monilia  2.  Follow-up next week as scheduled for IUD placement. Orders No orders of the defined types were placed in this encounter.   No orders of the defined types were placed in this encounter.     F/U  Return in about 1 week (around 05/31/2023).  Elonda Husky, M.D. 05/24/2023 9:42 AM

## 2023-05-24 NOTE — Progress Notes (Signed)
Patient presents today for 6 week postpartum follow-up. Patient had a cesarean delivery on 04/18/23. Recently she went to MAU due to concerns regarding her incision, she has started antibiotics and is doing better now. Mom is pumping while baby is in NICU. She states she would like an IUD for birth control. EPDS score of 0. She states no other questions or concerns at this time.

## 2023-05-27 ENCOUNTER — Encounter: Payer: Self-pay | Admitting: Obstetrics and Gynecology

## 2023-05-29 ENCOUNTER — Encounter: Payer: Self-pay | Admitting: Obstetrics and Gynecology

## 2023-05-29 ENCOUNTER — Ambulatory Visit (INDEPENDENT_AMBULATORY_CARE_PROVIDER_SITE_OTHER): Payer: 59 | Admitting: Obstetrics and Gynecology

## 2023-05-29 VITALS — BP 124/79 | HR 76 | Ht 67.0 in | Wt 308.2 lb

## 2023-05-29 DIAGNOSIS — Z30430 Encounter for insertion of intrauterine contraceptive device: Secondary | ICD-10-CM | POA: Diagnosis not present

## 2023-05-29 DIAGNOSIS — Z3202 Encounter for pregnancy test, result negative: Secondary | ICD-10-CM | POA: Diagnosis not present

## 2023-05-29 LAB — POCT URINE PREGNANCY: Preg Test, Ur: NEGATIVE

## 2023-05-29 LAB — MISC LABCORP TEST (SEND OUT): Labcorp test code: 8664

## 2023-05-29 MED ORDER — LEVONORGESTREL 20 MCG/DAY IU IUD
1.0000 | INTRAUTERINE_SYSTEM | Freq: Once | INTRAUTERINE | Status: AC
Start: 2023-05-29 — End: 2023-05-29
  Administered 2023-05-29: 1 via INTRAUTERINE

## 2023-05-29 NOTE — Progress Notes (Signed)
HPI:      [name] is a 32 y.o. G1P1001 who LMP was No LMP recorded.  Subjective:   She presents today for IUD insertion.  She desires IUD for birth control.    Hx: The following portions of the patient's history were reviewed and updated as appropriate:             She  has a past medical history of Abnormal fetal ultrasound (02/06/2023), Anxiety, Asthma (10/24/2022), Fatigue, History of COVID-19 (11/24/2020), History of COVID-19 (11/24/2020), Labor and delivery, indication for care (04/17/2023), Nevus, and Vaccine for human papilloma virus (HPV) types 6, 11, 16, and 18 administered. She does not have any pertinent problems on file. She  has a past surgical history that includes Lymph node dissection (Right, 2011); Arm skin lesion biopsy / excision (Left, 2012); Mole removal (Left); and Cesarean section (04/18/2023). Her family history includes Brain cancer in an other family member; Cancer (age of onset: 25) in an other family member; Colon polyps in her mother; Gout in her brother; Heart attack in her paternal grandmother; Hypothyroidism in her paternal grandmother; Migraines in her brother. She  reports that she has never smoked. She has never used smokeless tobacco. She reports that she does not currently use alcohol. She reports that she does not use drugs. She has a current medication list which includes the following prescription(s): ciprofloxacin and ibuprofen. She is allergic to mushroom extract complex and prednisone.       Review of Systems:  Review of Systems  Constitutional: Denied constitutional symptoms, night sweats, recent illness, fatigue, fever, insomnia and weight loss.  Eyes: Denied eye symptoms, eye pain, photophobia, vision change and visual disturbance.  Ears/Nose/Throat/Neck: Denied ear, nose, throat or neck symptoms, hearing loss, nasal discharge, sinus congestion and sore throat.  Cardiovascular: Denied cardiovascular symptoms, arrhythmia, chest  pain/pressure, edema, exercise intolerance, orthopnea and palpitations.  Respiratory: Denied pulmonary symptoms, asthma, pleuritic pain, productive sputum, cough, dyspnea and wheezing.  Gastrointestinal: Denied, gastro-esophageal reflux, melena, nausea and vomiting.  Genitourinary: Denied genitourinary symptoms including symptomatic vaginal discharge, pelvic relaxation issues, and urinary complaints.  Musculoskeletal: Denied musculoskeletal symptoms, stiffness, swelling, muscle weakness and myalgia.  Dermatologic: Denied dermatology symptoms, rash and scar.  Neurologic: Denied neurology symptoms, dizziness, headache, neck pain and syncope.  Psychiatric: Denied psychiatric symptoms, anxiety and depression.  Endocrine: Denied endocrine symptoms including hot flashes and night sweats.   Meds:   Current Outpatient Medications on File Prior to Visit  Medication Sig Dispense Refill   ciprofloxacin (CIPRO) 750 MG tablet Take 1 tablet (750 mg total) by mouth 2 (two) times daily for 7 days. 14 tablet 0   ibuprofen (ADVIL) 600 MG tablet Take 1 tablet (600 mg total) by mouth every 6 (six) hours. 30 tablet 0   No current facility-administered medications on file prior to visit.    Objective:     Vitals:   05/29/23 0958  BP: 124/79  Pulse: 76    Physical examination   Pelvic:   Vulva: Normal appearance.  No lesions.  Vagina: No lesions or abnormalities noted.  Support: Normal pelvic support.  Urethra No masses tenderness or scarring.  Meatus Normal size without lesions or prolapse.  Cervix: Normal appearance.  No lesions.  Anus: Normal exam.  No lesions.  Perineum: Normal exam.  No lesions.        Bimanual   Uterus: Normal size.  Non-tender.  Mobile.  AV.  Adnexae: No masses.  Non-tender to palpation.  Cul-de-sac: Negative  for abnormality.   IUD Procedure Pt has read the booklet and signed the appropriate forms regarding the Mirena IUD.  All of her questions have been answered.   The  cervix was cleansed with betadine solution.  After sounding the uterus and noting the position, the IUD was placed in the usual manner without problem.  The string was cut to the appropriate length.  The patient tolerated the procedure well.            [facility] # = [id number]   Assessment:    G1P1001 Patient Active Problem List   Diagnosis Date Noted   History of C-section 04/19/2023   BMI 45.0-49.9, adult (HCC) 10/24/2022     1. Encounter for IUD insertion       Plan:             F/U  Return in about 4 weeks (around 06/26/2023) for For IUD f/u.  Elonda Husky, M.D. 05/29/2023 10:16 AM

## 2023-05-29 NOTE — Progress Notes (Signed)
Patient presents today for IUD insertion. She states no other questions or concerns.   

## 2023-06-03 ENCOUNTER — Ambulatory Visit (HOSPITAL_COMMUNITY): Payer: Self-pay

## 2023-06-03 ENCOUNTER — Telehealth: Payer: Self-pay | Admitting: Obstetrics and Gynecology

## 2023-06-03 NOTE — Telephone Encounter (Signed)
Called and left a message for patient to call the office back to schedule IUD follow up with Dr Logan Bores

## 2023-06-03 NOTE — Lactation Note (Signed)
This note was copied from a baby's chart.  NICU Lactation Consultation Note  Patient Name: Melanie Frederick WGNFA'O Date: 06/03/2023 Age:30 wk.o.  Reason for consult: Follow-up assessment; NICU baby; 1st time breastfeeding; Primapara; Term  SUBJECTIVE  LC in to visit with P1 Mom of baby "Melanie Frederick" who had her gastrostomy placed today.  Baby has been sleepy since. Baby had her first gastrostomy feeding of 40 ml over 90 mins and tolerated this well.  Mom reports she is still pumping and expressing stable volumes of 180+ ml per session.   No concerned at present.  OBJECTIVE Infant data: No data recorded O2 Device: Room Air  Infant feeding assessment Scale for Readiness: 3   Maternal data: G1P1001 C-Section, Low Vertical Pumping frequency: 6 times per 24 hrs Pumped volume: 180 mL Flange Size: 21  WIC Program: No WIC Referral Sent?: Yes What county?: [place] Pump: Hands Free  Feeding Status: Scheduled 8-11-2-5 Feeding method: Gastrostomy  Maternal: Milk volume: Normal  INTERVENTIONS/PLAN Interventions: Interventions: Skin to skin; Breast massage; Hand express; DEBP Tools: Pump; Flanges Pump Education: Setup, frequency, and cleaning; Milk Storage  Plan: Consult Status: NICU follow-up NICU Follow-up type: Weekly NICU follow up   [name] 06/03/2023, 3:42 PM

## 2023-06-06 ENCOUNTER — Ambulatory Visit (HOSPITAL_COMMUNITY): Payer: Self-pay

## 2023-06-06 NOTE — Lactation Note (Signed)
This note was copied from a baby's chart.  NICU Lactation Consultation Note  Patient Name: Melanie Frederick Date: 06/06/2023 Age:30 wk.o.  Reason for consult: Follow-up assessment; NICU baby; Primapara; 1st time breastfeeding; Term; Other (Comment) (Laryngomalacia)  SUBJECTIVE Visited with family of 6 weeks old FT NICU female; Melanie Frederick is a P1 and reports pumping is going well, she continues pumping consistently and her supply remains stable, praised her for her efforts. Melanie Frederick is taking baby "Melanie Frederick" home today. Reviewed discharge education, anticipatory guidelines and the importance of consistent pumping to protect her supply. She voiced she's planning on taking baby to breast eventually, referral for [facility] OP F/U sent to [employer]. Female visitor present. All questions and concerns answered, family to contact [facility] services PRN.  OBJECTIVE Infant data: Mother's Current Feeding Choice: Breast Milk  O2 Device: Room Air  Infant feeding assessment Scale for Readiness: 3   Maternal data: G1P1001 C-Section, Low Vertical Pumping frequency: 6 times/24 hours Pumped volume: 150 mL (150-180 ml) Flange Size: 21  WIC Program: No WIC Referral Sent?: Yes What county?: [place] Pump: Personal (Spectra)  ASSESSMENT Infant: Feeding Status: Scheduled 9-12-3-6 Feeding method: Gastrostomy  Maternal: Milk volume: Normal  INTERVENTIONS/PLAN Interventions: Interventions: Breast feeding basics reviewed; Coconut oil; DEBP; Education Discharge Education: Outpatient recommendation; Outpatient Epic message sent Tools: Pump; Flanges; Coconut oil Pump Education: Setup, frequency, and cleaning; Milk Storage  Plan: Consult Status: Complete   Melanie Frederick 06/06/2023, 10:26 AM

## 2023-06-11 DIAGNOSIS — H0015 Chalazion left lower eyelid: Secondary | ICD-10-CM | POA: Diagnosis not present

## 2023-06-11 DIAGNOSIS — D2310 Other benign neoplasm of skin of unspecified eyelid, including canthus: Secondary | ICD-10-CM | POA: Diagnosis not present

## 2023-06-11 DIAGNOSIS — H0011 Chalazion right upper eyelid: Secondary | ICD-10-CM | POA: Diagnosis not present

## 2023-06-11 NOTE — Telephone Encounter (Signed)
I contacted the patient via phone. I left message for the patient to call back for 4 weeks (around 06/26/2023) for For IUD f/u with Dr. Logan Bores.

## 2023-06-20 ENCOUNTER — Ambulatory Visit: Payer: 59 | Admitting: Obstetrics and Gynecology

## 2023-06-20 DIAGNOSIS — Z30431 Encounter for routine checking of intrauterine contraceptive device: Secondary | ICD-10-CM

## 2023-06-24 ENCOUNTER — Emergency Department (HOSPITAL_COMMUNITY)
Admission: EM | Admit: 2023-06-24 | Discharge: 2023-06-25 | Disposition: A | Discharge status: Home / Self Care | Payer: 59 | Attending: Emergency Medicine | Admitting: Emergency Medicine

## 2023-06-24 ENCOUNTER — Ambulatory Visit (INDEPENDENT_AMBULATORY_CARE_PROVIDER_SITE_OTHER): Payer: 59

## 2023-06-24 ENCOUNTER — Other Ambulatory Visit (HOSPITAL_COMMUNITY)
Admission: RE | Admit: 2023-06-24 | Discharge: 2023-06-24 | Disposition: A | Discharge status: Home / Self Care | Payer: 59 | Source: Ambulatory Visit | Attending: Obstetrics and Gynecology | Admitting: Obstetrics and Gynecology

## 2023-06-24 ENCOUNTER — Other Ambulatory Visit: Payer: Self-pay

## 2023-06-24 VITALS — BP 116/77 | HR 94 | Wt 306.7 lb

## 2023-06-24 DIAGNOSIS — D72829 Elevated white blood cell count, unspecified: Secondary | ICD-10-CM | POA: Diagnosis not present

## 2023-06-24 DIAGNOSIS — L02412 Cutaneous abscess of left axilla: Secondary | ICD-10-CM | POA: Insufficient documentation

## 2023-06-24 DIAGNOSIS — N898 Other specified noninflammatory disorders of vagina: Secondary | ICD-10-CM | POA: Insufficient documentation

## 2023-06-24 DIAGNOSIS — R079 Chest pain, unspecified: Secondary | ICD-10-CM | POA: Insufficient documentation

## 2023-06-24 DIAGNOSIS — L0291 Cutaneous abscess, unspecified: Secondary | ICD-10-CM

## 2023-06-24 LAB — BASIC METABOLIC PANEL
Anion gap: 9 (ref 5–15)
BUN: 11 mg/dL (ref 6–20)
CO2: 23 mmol/L (ref 22–32)
Calcium: 8.8 mg/dL — ABNORMAL LOW (ref 8.9–10.3)
Chloride: 104 mmol/L (ref 98–111)
Creatinine, Ser: 0.91 mg/dL (ref 0.44–1.00)
GFR, Estimated: >60 mL/min (ref >60)
Glucose, Bld: 111 mg/dL — ABNORMAL HIGH (ref 70–99)
Potassium: 4.7 mmol/L (ref 3.5–5.1)
Sodium: 136 mmol/L (ref 135–145)

## 2023-06-24 LAB — CBC
HCT: 37.6 % (ref 36.0–46.0)
Hemoglobin: 11.5 g/dL — ABNORMAL LOW (ref 12.0–15.0)
MCH: 26.9 pg (ref 26.0–34.0)
MCHC: 30.6 g/dL (ref 30.0–36.0)
MCV: 87.9 fL (ref 80.0–100.0)
Platelets: 374 10*3/uL (ref 150–400)
RBC: 4.28 MIL/uL (ref 3.87–5.11)
RDW: 13.8 % (ref 11.5–15.5)
WBC: 10.7 10*3/uL — ABNORMAL HIGH (ref 4.0–10.5)
nRBC: 0 % (ref 0.0–0.2)

## 2023-06-24 NOTE — Progress Notes (Signed)
   GYN ENCOUNTER  Encounter for IUD follow up  Subjective  HPI: MBER GROSHANS is a 32 y.o. G1P1001 who presents today for follow up after placement of Mirena IUD on 05/29/23.  She reports some mild bleeding since Mirena insertion but no other complaints. She has resumed intercourse and neither she nor her partner are bothered by the strings.  She also reports some vaginal dryness and itching but denies odor or discharge.  Additionally, in the last 48 hours, she has developed a large golf-ball sized mass in her left arm just distal from her left axillary that has more than doubled in size. She denies fever or other signs of infection although the area around the mass is red and swollen.   Past Medical History:  Diagnosis Date   Abnormal fetal ultrasound 02/06/2023   Bowed short femurs bilaterally. Normal genetics.     Anxiety    Asthma 10/24/2022   Fatigue    History of COVID-19 11/24/2020   Diagnosed January 2022   History of COVID-19 11/24/2020   Diagnosed January 2022   Labor and delivery, indication for care 04/17/2023   Nevus    Vaccine for human papilloma virus (HPV) types 6, 11, 16, and 18 administered    Past Surgical History:  Procedure Laterality Date   ARM SKIN LESION BIOPSY / EXCISION Left 2012   CESAREAN SECTION  04/18/2023   Procedure: CESAREAN SECTION;  Surgeon: Linzie Collin, MD;  Location: [facility] ORS;  Service: Obstetrics;;   LYMPH NODE DISSECTION Right 2011   MOLE REMOVAL Left    moval removal off of back   OB History     Gravida  1   Para  1   Term  1   Preterm  0   AB  0   Living  1      SAB  0   IAB  0   Ectopic  0   Multiple  0   Live Births  1          Allergies  Allergen Reactions   Mushroom Extract Complex Hives and Swelling   Prednisone Palpitations    Review of Systems  12 point ROS negative except for pertinent positives noted in HPO above.   Objective  BP 116/77   Pulse 94   Wt (!) 306 lb 11.2 oz  (139.1 kg)   BMI 48.04 kg/m   Physical examination GENERAL APPEARANCE: alert, well appearing LUNGS: normal work of breathing HEART: normal heart rate EXTREMITIES: mass approximately 5 x 8 cm palpated just distal of the left axillary. Skin of surrounding area erythematous.    Assessment/Plan - Pelvic exam deferred with shared decision-making given no concerns with IUD.  - Patient self-collected vaginal swab to r/o infection related to vaginal itching. - Recommended follow up with urgent care or PCP for mass in arm. Low suspicion for mastitis given location and lack of fever.   Lindalou Hose Maleaha Hughett, CNM  06/24/23 2:35 PM

## 2023-06-24 NOTE — ED Triage Notes (Signed)
Pt arrives to ED c/o bump under left arm x several days. Swelling is getting progressively worse, pt reports pain is 7/10. Lump noted outside left armpit with redness around area.

## 2023-06-25 MED ORDER — TETANUS-DIPHTH-ACELL PERTUSSIS 5-2.5-18.5 LF-MCG/0.5 IM SUSY: 0.5000 mL | PREFILLED_SYRINGE | Freq: Once | INTRAMUSCULAR | Status: DC

## 2023-06-25 MED ORDER — LIDOCAINE HCL (PF) 1 % IJ SOLN
5.0000 mL | Freq: Once | INTRAMUSCULAR | Status: AC
  Administered 2023-06-25: 5 mL
  Filled 2023-06-25: qty 5

## 2023-06-25 MED ORDER — SULFAMETHOXAZOLE-TRIMETHOPRIM 800-160 MG PO TABS: 1.0000 | ORAL_TABLET | Freq: Two times a day (BID) | ORAL | 0 refills | Status: AC

## 2023-06-25 MED ORDER — SULFAMETHOXAZOLE-TRIMETHOPRIM 800-160 MG PO TABS
1.0000 | ORAL_TABLET | Freq: Once | ORAL | Status: AC
  Administered 2023-06-25: 1 via ORAL
  Filled 2023-06-25: qty 1

## 2023-06-25 NOTE — Discharge Instructions (Addendum)
Evaluation today revealed that he did have an abscess about your left armpit.  I&D procedure went well.  Please see attached document for abscess care at home.  If there are any concerns for worsening infection, please return for further evaluation to the emergency department.  Otherwise please follow-up with your primary care provider in the next 3 to 5 days for reevaluation.

## 2023-06-25 NOTE — ED Provider Notes (Signed)
[place] EMERGENCY DEPARTMENT AT [facility] Provider Note   CSN: [id number] Arrival date & time: 06/24/23  2145     History  Chief Complaint  Patient presents with   Mass   HPI Melanie Frederick is a 32 y.o. female presenting for mass on her left axilla.  States she noticed it about a week and a half ago.  The mass is notably increased in size with swelling and is tender to touch.  Also associates redness around the site as well.  Denies any oozing or bleeding from the site.  Denies fever or chills.  Denies history of abscess. States she had a tetanus shot about 3 or 4 months ago.   HPI     Home Medications Prior to Admission medications   Medication Sig Start Date End Date Taking? Authorizing Provider  sulfamethoxazole-trimethoprim (BACTRIM DS) 800-160 MG tablet Take 1 tablet by mouth 2 (two) times daily for 7 days. 06/25/23 07/02/23 Yes Gareth Eagle, PA-C  ibuprofen (ADVIL) 600 MG tablet Take 1 tablet (600 mg total) by mouth every 6 (six) hours. 04/21/23   Lazaro Arms, MD  levonorgestrel (MIRENA) 20 MCG/DAY IUD 1 each by Intrauterine route once.    [provider]      Allergies    Mushroom extract complex and Prednisone    Review of Systems   See HPI for pertinent positives  Physical Exam Updated Vital Signs BP 115/60   Pulse 89   Temp 98.4 F (36.9 C)   Resp 19   Ht 5\' 7"  (1.702 m)   Wt (!) 138.8 kg   SpO2 98%   BMI 47.93 kg/m  Physical Exam Constitutional:      Appearance: Normal appearance.  HENT:     Head: Normocephalic.     Nose: Nose normal.  Eyes:     Conjunctiva/sclera: Conjunctivae normal.  Pulmonary:     Effort: Pulmonary effort is normal.  Skin:    Comments: Mass noted about the left axilla. It is fluctuant, tender to touch, with surrounding erythema and edema  Neurological:     Mental Status: She is alert.  Psychiatric:        Mood and Affect: Mood normal.     ED Results / Procedures / Treatments   Labs (all  labs ordered are listed, but only abnormal results are displayed) Labs Reviewed  CBC - Abnormal; Notable for the following components:      Result Value   WBC 10.7 (*)    Hemoglobin 11.5 (*)    All other components within normal limits  BASIC METABOLIC PANEL - Abnormal; Notable for the following components:   Glucose, Bld 111 (*)    Calcium 8.8 (*)    All other components within normal limits    EKG None  Radiology No results found.  Procedures .Marland KitchenIncision and Drainage  Date/Time: 06/25/2023 9:58 AM  Performed by: Gareth Eagle, PA-C Authorized by: Gareth Eagle, PA-C   Consent:    Consent obtained:  Verbal   Consent given by:  Patient   Risks discussed:  Bleeding, incomplete drainage, pain and damage to other organs   Alternatives discussed:  No treatment Universal protocol:    Procedure explained and questions answered to patient or proxy's satisfaction: yes     Relevant documents present and verified: yes     Test results available : yes     Imaging studies available: yes     Required blood products, implants, devices, and  special equipment available: yes     Site/side marked: yes     Immediately prior to procedure, a time out was called: yes     Patient identity confirmed:  Verbally with patient Location:    Type:  Abscess Pre-procedure details:    Skin preparation:  Betadine Anesthesia:    Anesthesia method:  Local infiltration   Local anesthetic:  Lidocaine 1% w/o epi Procedure type:    Complexity:  Complex Procedure details:    Incision types:  Single straight   Incision depth:  Subcutaneous   Wound management:  Probed and deloculated, irrigated with saline and extensive cleaning   Drainage:  Purulent   Drainage amount:  Moderate   Packing materials:  1/4 in gauze Post-procedure details:    Procedure completion:  Tolerated well, no immediate complications     Medications Ordered in ED Medications  sulfamethoxazole-trimethoprim (BACTRIM DS)  800-160 MG per tablet 1 tablet (has no administration in time range)  lidocaine (PF) (XYLOCAINE) 1 % injection 5 mL (5 mLs Infiltration Given 06/25/23 0845)    ED Course/ Medical Decision Making/ A&P                                 Medical Decision Making Amount and/or Complexity of Data Reviewed Labs: ordered.  Risk Prescription drug management.   32 year old well-appearing female presenting for mass about the left axilla.  Exam notable for what is likely an abscess under the left axilla.  Labs reassuring with some mild leukocytosis. I&D procedure was well-tolerated.  See procedure note above.  Started patient on Bactrim.  Advised to follow-up with her PCP.  Discussed appropriate wound care at home.  Vital stable.  Discharged home in good condition.        Final Clinical Impression(s) / ED Diagnoses Final diagnoses:  Abscess    Rx / DC Orders ED Discharge Orders          Ordered    sulfamethoxazole-trimethoprim (BACTRIM DS) 800-160 MG tablet  2 times daily        06/25/23 0956              Gareth Eagle, PA-C 06/25/23 1000    Lonell Grandchild, MD 06/25/23 [phone]

## 2023-06-27 ENCOUNTER — Telehealth: Payer: Self-pay

## 2023-06-27 NOTE — Telephone Encounter (Signed)
Pt returned call; adv the bactrim will probably help with this new abscess after she has been on it for a few days.  Pt's concern is her incision had an abscess and was give antibx and wonders if maybe the infection didn't completely get out of her body.  Adv will sent msg to schedulers to be seen in the next day or so and in the meantime she can apply warm compresses to the new abscess; it will either drain or go away. Pt okay with plan.

## 2023-06-27 NOTE — Telephone Encounter (Signed)
Pt called after hour nurse 9/18 at 5:12pm; states that she just started Bactrim for an abscess on her left arm and she is now noting an abscess on her right thigh in the front.  It is also swollen and painful also; it is not draining anything at this time.  Was seen in the ER 9/17. After hour nurse adv she be seen within the next 3-4 hours.[phone]  [facility]

## 2023-06-27 NOTE — Telephone Encounter (Signed)
I spoke with Dr. Valentino Saxon face to face. She advised the patient started antibiotic two days ago, She need to give it time to work. It should take care of the new abscess, also to instruction the patient when cleaning to use antibiotic soap and use warm compress. Offer  the patient a appointment for Monday or Tuesday. If the patient isn't any better have her be seen otherwise she can cancel the day of scheduled appointment". I contacted the patietn via phone. I advise word for word above message to patient. The patient is scheduled as a work in with Dr Marice Potter for Monday,9/23 at 2:55 pm.

## 2023-07-01 ENCOUNTER — Ambulatory Visit (INDEPENDENT_AMBULATORY_CARE_PROVIDER_SITE_OTHER): Payer: 59 | Admitting: Obstetrics & Gynecology

## 2023-07-01 ENCOUNTER — Encounter: Payer: Self-pay | Admitting: Obstetrics & Gynecology

## 2023-07-01 VITALS — BP 115/72 | HR 86 | Ht 67.0 in | Wt 309.0 lb

## 2023-07-01 DIAGNOSIS — L02415 Cutaneous abscess of right lower limb: Secondary | ICD-10-CM | POA: Diagnosis not present

## 2023-07-01 DIAGNOSIS — R102 Pelvic and perineal pain: Secondary | ICD-10-CM

## 2023-07-01 DIAGNOSIS — L0291 Cutaneous abscess, unspecified: Secondary | ICD-10-CM

## 2023-07-01 NOTE — Progress Notes (Signed)
GYNECOLOGY PROGRESS NOTE  Subjective:    Patient ID: Melanie Frederick, female    DOB: 31-Dec-1990, 32 y.o.   MRN: [id number]  HPI  Patient is a 32 y.o. G1P1001 here for 2 reasons. She developed a small abscess on her upper right thigh but it has already started to regress. She had one in her right axilla which required I&D in the ER last month.  She also has had 1 day of pelvic pain. It is not bad enough to take IBU or tylenol. She just wanted to make sure that her Sarina Ill is "in the right place".   The following portions of the patient's history were reviewed and updated as appropriate: allergies, current medications, past family history, past medical history, past social history, past surgical history, and problem list.  Review of Systems Pertinent items are noted in HPI.  She had a normal pap in 2023.  Objective:   Blood pressure 115/72, pulse 86, height 5\' 7"  (1.702 m), weight (!) 309 lb (140.2 kg), currently breastfeeding. Body mass index is 48.4 kg/m. Well nourished, well hydrated White female, no apparent distress She is ambulating and conversing normally. The incision in her right axilla is completely healed. She has a 1 cm firm mass in her upper right thigh. It is not particularly painful.  I placed a speculum and saw Mirena strings that are about 2 cm long.  She has a very small amount of spotting. I did a bimanual exam and did not appreciate and tenderness or masses.  Assessment:   Pelvic discomfort- I have recommended IBU or tylenol if the pain becomes bad enough to necessitate meds. I have reassured her that I believe her IUD is in proper position.   Plan:   As above  Rec annual in a year/prn sooner Pap due in about 3 years.

## 2023-07-02 ENCOUNTER — Encounter: Payer: Self-pay | Admitting: Obstetrics and Gynecology

## 2023-07-10 ENCOUNTER — Encounter: Payer: Self-pay | Admitting: Obstetrics and Gynecology

## 2023-07-11 ENCOUNTER — Encounter: Payer: Self-pay | Admitting: Obstetrics

## 2023-07-11 ENCOUNTER — Ambulatory Visit: Payer: 59 | Admitting: Obstetrics

## 2023-07-11 VITALS — BP 135/73 | HR 86 | Ht 67.0 in | Wt 307.0 lb

## 2023-07-11 DIAGNOSIS — L732 Hidradenitis suppurativa: Secondary | ICD-10-CM

## 2023-07-11 MED ORDER — CEPHALEXIN 500 MG PO CAPS: 500.0000 mg | ORAL_CAPSULE | Freq: Three times a day (TID) | ORAL | 0 refills | Status: AC

## 2023-07-11 NOTE — Progress Notes (Signed)
    GYNECOLOGY PROGRESS NOTE  Subjective:    Patient ID: PAULETTE ROCKFORD, female    DOB: 11/24/1990, 32 y.o.   MRN: [id number]  HPI  Patient is a 32 y.o. G71P1001 female who presents for "red knots" in both armpits.She noticed them a few days ago. Describes as red, painful knots and is worried they will grow larger and become more painful. A couple weeks ago she had an abscess the size of a baseball in her left armpit, had I&D in [facility] ED on 06/25/23, it resolved, but now there are more in the same armpit and the other side. She has had these in her groin as well, but not currently. She is also concerned because her c-section scar got infected with staph. No documented hx of MRSA.   The following portions of the patient's history were reviewed and updated as appropriate: allergies, current medications, past family history, past medical history, past social history, past surgical history, and problem list.  Review of Systems Pertinent items are noted in HPI.   Objective:   Blood pressure 135/73, pulse 86, height 5\' 7"  (1.702 m), weight (!) 307 lb (139.3 kg), currently breastfeeding. Body mass index is 48.08 kg/m.  GEN: A&O, NAD, obese RESP: NWOB SKIN: Bilat axilla with erythematous nodular lesions without pustules or ulcerations, non-fluctuant and non-indurated. Scar from prior I&D noted and well-healed. NEURO: grossly intact  Assessment:   1. Axillary hidradenitis suppurativa      Plan:  Will trial a prolonged course of Keflex and send referral to dermatology. Reviewed safety of Keflex in lactation. Encouraged pt to continue keeping axillary regions clean and dry as possible. If forms another large, painful abscess, may RTC for I&D. Follow up as scheduled for routine care.   Julieanne Manson, DO [facility]

## 2023-07-15 ENCOUNTER — Encounter: Payer: Self-pay | Admitting: Obstetrics and Gynecology

## 2023-07-19 ENCOUNTER — Other Ambulatory Visit: Payer: Self-pay | Admitting: Obstetrics and Gynecology

## 2023-07-19 DIAGNOSIS — O924 Hypogalactia: Secondary | ICD-10-CM

## 2023-07-19 MED ORDER — METOCLOPRAMIDE HCL 10 MG PO TABS
10.0000 mg | ORAL_TABLET | Freq: Three times a day (TID) | ORAL | 0 refills | Status: AC
Start: 2023-07-19 — End: ?

## 2023-10-03 ENCOUNTER — Encounter: Payer: Self-pay | Admitting: Family Medicine

## 2023-10-03 ENCOUNTER — Telehealth: Payer: 59 | Admitting: Family Medicine

## 2023-10-03 DIAGNOSIS — J069 Acute upper respiratory infection, unspecified: Secondary | ICD-10-CM

## 2023-10-03 NOTE — Patient Instructions (Signed)
  Gillie Manners, thank you for joining Freddy Finner, NP for today's virtual visit.  While this provider is not your primary care provider (PCP), if your PCP is located in our provider database this encounter information will be shared with them immediately following your visit.   A [facility] MyChart account gives you access to today's visit and all your visits, tests, and labs performed at [facility] " click here if you don't have a [facility] MyChart account or go to [email or web address]  Consent: (Patient) JANAAN CHAMPOUX provided verbal consent for this virtual visit at the beginning of the encounter.  Current Medications:  Current Outpatient Medications:    ibuprofen (ADVIL) 600 MG tablet, Take 1 tablet (600 mg total) by mouth every 6 (six) hours., Disp: 30 tablet, Rfl: 0   levonorgestrel (MIRENA) 20 MCG/DAY IUD, 1 each by Intrauterine route once., Disp: , Rfl:    metoCLOPramide (REGLAN) 10 MG tablet, Take 1 tablet (10 mg total) by mouth 3 (three) times daily before meals., Disp: 42 tablet, Rfl: 0   Medications ordered in this encounter:  No orders of the defined types were placed in this encounter.    *If you need refills on other medications prior to your next appointment, please contact your pharmacy*  Follow-Up: Call back or seek an in-person evaluation if the symptoms worsen or if the condition fails to improve as anticipated.  [facility] [phone]  Other Instructions   URI recommendations: - Increased rest - Increasing Fluids - Acetaminophen  - Salt water gargling, chloraseptic spray and throat lozenges - Saline nasal spray if congestion or if nasal passages feel dry. - Humidifying the air.     If you have been instructed to have an in-person evaluation today at a local Urgent Care facility, please use the link below. It will take you to a list of all of our available [facility], including address, phone  number and hours of operation. Please do not delay care.  [facility]  If you or a family member do not have a primary care provider, use the link below to schedule a visit and establish care. When you choose a [facility] primary care physician or advanced practice provider, you gain a long-term partner in health. Find a Primary Care Provider  Learn more about [facility] in-office and virtual care options:  - Get Care Now

## 2023-10-03 NOTE — Progress Notes (Signed)
Virtual Visit Consent   AVAH SMETHURST, you are scheduled for a virtual visit with a [facility] provider today. Just as with appointments in the office, your consent must be obtained to participate. Your consent will be active for this visit and any virtual visit you may have with one of our providers in the next 365 days. If you have a MyChart account, a copy of this consent can be sent to you electronically.  As this is a virtual visit, video technology does not allow for your provider to perform a traditional examination. This may limit your provider's ability to fully assess your condition. If your provider identifies any concerns that need to be evaluated in person or the need to arrange testing (such as labs, EKG, etc.), we will make arrangements to do so. Although advances in technology are sophisticated, we cannot ensure that it will always work on either your end or our end. If the connection with a video visit is poor, the visit may have to be switched to a telephone visit. With either a video or telephone visit, we are not always able to ensure that we have a secure connection.  By engaging in this virtual visit, you consent to the provision of healthcare and authorize for your insurance to be billed (if applicable) for the services provided during this visit. Depending on your insurance coverage, you may receive a charge related to this service.  I need to obtain your verbal consent now. Are you willing to proceed with your visit today? Melanie Frederick has provided verbal consent on 10/03/2023 for a virtual visit (video or telephone). Freddy Finner, NP  Date: 10/03/2023 10:50 AM  Virtual Visit via Video Note   I, Freddy Finner, connected with  Melanie Frederick  ([id number], 01/08/1991) on 10/03/23 at 11:00 AM EST by a video-enabled telemedicine application and verified that I am speaking with the correct person using two identifiers.  Location: Patient: Virtual Visit Location  Patient: Home Provider: Virtual Visit Location Provider: Home Office   I discussed the limitations of evaluation and management by telemedicine and the availability of in person appointments. The patient expressed understanding and agreed to proceed.    History of Present Illness: Melanie Frederick is a 32 y.o. who identifies as a female who was assigned female at birth, and is being seen today for uri   Onset was 3 days ago with stuffy nose Associated symptoms are sore throat, runny nose, headache, ear ache, and dizziness and nausea mild cough- but not much   Modifying factors are nothing she is breastfeeding  Denies chest pain, shortness of breath, fevers, chills  Exposure to sick contacts- known with family members sick as well  COVID test: no Vaccines: not up to date   Problems:  Patient Active Problem List   Diagnosis Date Noted   History of C-section 04/19/2023   BMI 45.0-49.9, adult (HCC) 10/24/2022    Allergies:  Allergies  Allergen Reactions   Mushroom Extract Complex (Do Not Select) Hives and Swelling   Prednisone Palpitations   Medications:  Current Outpatient Medications:    ibuprofen (ADVIL) 600 MG tablet, Take 1 tablet (600 mg total) by mouth every 6 (six) hours., Disp: 30 tablet, Rfl: 0   levonorgestrel (MIRENA) 20 MCG/DAY IUD, 1 each by Intrauterine route once., Disp: , Rfl:    metoCLOPramide (REGLAN) 10 MG tablet, Take 1 tablet (10 mg total) by mouth 3 (three) times daily before meals., Disp: 42 tablet, Rfl:  0  Observations/Objective: Patient is well-developed, well-nourished in no acute distress.  Resting comfortably  at home.  Head is normocephalic, atraumatic.  No labored breathing.  Speech is clear and coherent with logical content.  Patient is alert and oriented at baseline.    Assessment and Plan:   1. Viral URI (Primary)  URI recommendations: - Increased rest - Increasing Fluids - Acetaminophen  - Salt water gargling, chloraseptic spray  and throat lozenges - Saline nasal spray if congestion or if nasal passages feel dry. - Humidifying the air.    -breastfeeding needs to watch what is best to use at this time. -work note provided  Follow Up Instructions: I discussed the assessment and treatment plan with the patient. The patient was provided an opportunity to ask questions and all were answered. The patient agreed with the plan and demonstrated an understanding of the instructions.  A copy of instructions were sent to the patient via MyChart unless otherwise noted below.    The patient was advised to call back or seek an in-person evaluation if the symptoms worsen or if the condition fails to improve as anticipated.    Freddy Finner, NP

## 2024-05-25 ENCOUNTER — Ambulatory Visit: Admitting: Physician Assistant

## 2024-05-26 DIAGNOSIS — N760 Acute vaginitis: Secondary | ICD-10-CM | POA: Diagnosis not present

## 2024-05-26 DIAGNOSIS — Z113 Encounter for screening for infections with a predominantly sexual mode of transmission: Secondary | ICD-10-CM | POA: Diagnosis not present

## 2024-05-26 DIAGNOSIS — Z1151 Encounter for screening for human papillomavirus (HPV): Secondary | ICD-10-CM | POA: Diagnosis not present

## 2024-05-26 DIAGNOSIS — N771 Vaginitis, vulvitis and vulvovaginitis in diseases classified elsewhere: Secondary | ICD-10-CM | POA: Diagnosis not present

## 2024-05-26 DIAGNOSIS — Z124 Encounter for screening for malignant neoplasm of cervix: Secondary | ICD-10-CM | POA: Diagnosis not present

## 2024-05-26 DIAGNOSIS — Z98891 History of uterine scar from previous surgery: Secondary | ICD-10-CM | POA: Diagnosis not present

## 2024-05-26 DIAGNOSIS — Z01419 Encounter for gynecological examination (general) (routine) without abnormal findings: Secondary | ICD-10-CM | POA: Diagnosis not present

## 2024-05-26 DIAGNOSIS — Z30432 Encounter for removal of intrauterine contraceptive device: Secondary | ICD-10-CM | POA: Diagnosis not present

## 2024-05-26 DIAGNOSIS — Z309 Encounter for contraceptive management, unspecified: Secondary | ICD-10-CM | POA: Diagnosis not present

## 2024-05-26 DIAGNOSIS — Z6841 Body Mass Index (BMI) 40.0 and over, adult: Secondary | ICD-10-CM | POA: Diagnosis not present

## 2024-06-03 DIAGNOSIS — D2310 Other benign neoplasm of skin of unspecified eyelid, including canthus: Secondary | ICD-10-CM | POA: Diagnosis not present

## 2024-06-18 DIAGNOSIS — H119 Unspecified disorder of conjunctiva: Secondary | ICD-10-CM | POA: Diagnosis not present

## 2024-06-18 DIAGNOSIS — H0011 Chalazion right upper eyelid: Secondary | ICD-10-CM | POA: Diagnosis not present

## 2024-06-25 DIAGNOSIS — F330 Major depressive disorder, recurrent, mild: Secondary | ICD-10-CM | POA: Diagnosis not present

## 2024-07-09 DIAGNOSIS — H0011 Chalazion right upper eyelid: Secondary | ICD-10-CM | POA: Diagnosis not present

## 2024-07-09 DIAGNOSIS — D485 Neoplasm of uncertain behavior of skin: Secondary | ICD-10-CM | POA: Diagnosis not present

## 2024-07-09 DIAGNOSIS — H119 Unspecified disorder of conjunctiva: Secondary | ICD-10-CM | POA: Diagnosis not present

## 2024-07-14 DIAGNOSIS — F330 Major depressive disorder, recurrent, mild: Secondary | ICD-10-CM | POA: Diagnosis not present

## 2024-07-30 DIAGNOSIS — F330 Major depressive disorder, recurrent, mild: Secondary | ICD-10-CM | POA: Diagnosis not present

## 2024-07-31 DIAGNOSIS — H0011 Chalazion right upper eyelid: Secondary | ICD-10-CM | POA: Diagnosis not present

## 2024-07-31 DIAGNOSIS — H1011 Acute atopic conjunctivitis, right eye: Secondary | ICD-10-CM | POA: Diagnosis not present

## 2024-08-20 DIAGNOSIS — F330 Major depressive disorder, recurrent, mild: Secondary | ICD-10-CM | POA: Diagnosis not present

## 2024-12-16 ENCOUNTER — Ambulatory Visit: Admitting: Physician Assistant
